# Patient Record
Sex: Female | Born: 1965
Health system: Southern US, Community
[De-identification: ages and names within clinical notes are randomized; demographics above are authoritative.]

## PROBLEM LIST (undated history)

## (undated) DIAGNOSIS — G4700 Insomnia, unspecified: Secondary | ICD-10-CM

## (undated) DIAGNOSIS — I10 Essential (primary) hypertension: Secondary | ICD-10-CM

## (undated) DIAGNOSIS — M4802 Spinal stenosis, cervical region: Secondary | ICD-10-CM

## (undated) DIAGNOSIS — M25571 Pain in right ankle and joints of right foot: Secondary | ICD-10-CM

## (undated) DIAGNOSIS — M25519 Pain in unspecified shoulder: Secondary | ICD-10-CM

## (undated) DIAGNOSIS — R238 Other skin changes: Secondary | ICD-10-CM

## (undated) DIAGNOSIS — E7800 Pure hypercholesterolemia, unspecified: Secondary | ICD-10-CM

## (undated) DIAGNOSIS — K219 Gastro-esophageal reflux disease without esophagitis: Secondary | ICD-10-CM

## (undated) DIAGNOSIS — N644 Mastodynia: Secondary | ICD-10-CM

## (undated) DIAGNOSIS — M4712 Other spondylosis with myelopathy, cervical region: Secondary | ICD-10-CM

## (undated) DIAGNOSIS — R609 Edema, unspecified: Secondary | ICD-10-CM

## (undated) DIAGNOSIS — R7309 Other abnormal glucose: Secondary | ICD-10-CM

## (undated) DIAGNOSIS — E739 Lactose intolerance, unspecified: Secondary | ICD-10-CM

## (undated) DIAGNOSIS — M25562 Pain in left knee: Secondary | ICD-10-CM

## (undated) HISTORY — DX: Pain in left knee: M25.562

## (undated) HISTORY — PX: TUBAL LIGATION: SHX77

## (undated) HISTORY — DX: Gastro-esophageal reflux disease without esophagitis: K21.9

## (undated) HISTORY — DX: Other abnormal glucose: R73.09

## (undated) HISTORY — DX: Essential (primary) hypertension: I10

## (undated) HISTORY — DX: Pain in right ankle and joints of right foot: M25.571

## (undated) HISTORY — DX: Other spondylosis with myelopathy, cervical region: M47.12

## (undated) HISTORY — PX: ENDOMETRIAL ABLATION: SHX621

## (undated) HISTORY — DX: Pure hypercholesterolemia, unspecified: E78.00

## (undated) HISTORY — DX: Other skin changes: R23.8

## (undated) HISTORY — DX: Spinal stenosis, cervical region: M48.02

## (undated) HISTORY — DX: Lactose intolerance, unspecified: E73.9

## (undated) HISTORY — DX: Pain in unspecified shoulder: M25.519

## (undated) HISTORY — DX: Edema, unspecified: R60.9

## (undated) HISTORY — DX: Mastodynia: N64.4

## (undated) HISTORY — DX: Insomnia, unspecified: G47.00

## (undated) HISTORY — PX: OTHER SURGICAL HISTORY: SHX169

---

## 1998-04-15 ENCOUNTER — Inpatient Hospital Stay (HOSPITAL_COMMUNITY): Admission: AD | Admit: 1998-04-15 | Discharge: 1998-04-15 | Payer: Self-pay | Admitting: Obstetrics and Gynecology

## 1998-05-09 ENCOUNTER — Inpatient Hospital Stay (HOSPITAL_COMMUNITY): Admission: AD | Admit: 1998-05-09 | Discharge: 1998-05-10 | Payer: Self-pay | Admitting: Obstetrics and Gynecology

## 1999-03-22 ENCOUNTER — Emergency Department (HOSPITAL_COMMUNITY): Admission: EM | Admit: 1999-03-22 | Discharge: 1999-03-22 | Payer: Self-pay | Admitting: Emergency Medicine

## 1999-06-04 ENCOUNTER — Encounter: Payer: Self-pay | Admitting: Family Medicine

## 1999-06-04 ENCOUNTER — Ambulatory Visit (HOSPITAL_COMMUNITY): Admission: RE | Admit: 1999-06-04 | Discharge: 1999-06-04 | Payer: Self-pay | Admitting: Family Medicine

## 1999-11-19 ENCOUNTER — Encounter: Admission: RE | Admit: 1999-11-19 | Discharge: 1999-11-19 | Payer: Self-pay | Admitting: Family Medicine

## 1999-11-19 ENCOUNTER — Encounter: Payer: Self-pay | Admitting: Family Medicine

## 1999-12-29 ENCOUNTER — Other Ambulatory Visit: Admission: RE | Admit: 1999-12-29 | Discharge: 1999-12-29 | Payer: Self-pay | Admitting: Obstetrics and Gynecology

## 2000-01-01 ENCOUNTER — Observation Stay (HOSPITAL_COMMUNITY): Admission: RE | Admit: 2000-01-01 | Discharge: 2000-01-02 | Payer: Self-pay | Admitting: Obstetrics and Gynecology

## 2000-01-01 ENCOUNTER — Encounter (INDEPENDENT_AMBULATORY_CARE_PROVIDER_SITE_OTHER): Payer: Self-pay | Admitting: Specialist

## 2001-02-11 ENCOUNTER — Other Ambulatory Visit: Admission: RE | Admit: 2001-02-11 | Discharge: 2001-02-11 | Payer: Self-pay | Admitting: Obstetrics and Gynecology

## 2001-09-30 ENCOUNTER — Ambulatory Visit (HOSPITAL_COMMUNITY): Admission: AD | Admit: 2001-09-30 | Discharge: 2001-09-30 | Payer: Self-pay | Admitting: Obstetrics and Gynecology

## 2003-04-04 ENCOUNTER — Other Ambulatory Visit: Admission: RE | Admit: 2003-04-04 | Discharge: 2003-04-04 | Payer: Self-pay | Admitting: Obstetrics and Gynecology

## 2004-11-12 ENCOUNTER — Other Ambulatory Visit: Admission: RE | Admit: 2004-11-12 | Discharge: 2004-11-12 | Payer: Self-pay | Admitting: Obstetrics and Gynecology

## 2005-12-09 ENCOUNTER — Other Ambulatory Visit: Admission: RE | Admit: 2005-12-09 | Discharge: 2005-12-09 | Payer: Self-pay | Admitting: Obstetrics and Gynecology

## 2006-11-12 ENCOUNTER — Ambulatory Visit (HOSPITAL_COMMUNITY): Admission: RE | Admit: 2006-11-12 | Discharge: 2006-11-12 | Payer: Self-pay | Admitting: Obstetrics and Gynecology

## 2006-11-12 ENCOUNTER — Encounter (INDEPENDENT_AMBULATORY_CARE_PROVIDER_SITE_OTHER): Payer: Self-pay | Admitting: Specialist

## 2009-07-05 ENCOUNTER — Encounter: Payer: Self-pay | Admitting: Internal Medicine

## 2009-07-05 ENCOUNTER — Ambulatory Visit: Payer: Self-pay | Admitting: Internal Medicine

## 2009-07-05 ENCOUNTER — Other Ambulatory Visit: Admission: RE | Admit: 2009-07-05 | Discharge: 2009-07-05 | Payer: Self-pay | Admitting: Internal Medicine

## 2009-07-05 DIAGNOSIS — I10 Essential (primary) hypertension: Secondary | ICD-10-CM

## 2009-07-05 HISTORY — DX: Essential (primary) hypertension: I10

## 2009-07-05 LAB — CONVERTED CEMR LAB
Bilirubin Urine: NEGATIVE
Blood in Urine, dipstick: NEGATIVE
Chlamydia, DNA Probe: NEGATIVE
GC Probe Amp, Genital: NEGATIVE
Glucose, Urine, Semiquant: NEGATIVE
Ketones, urine, test strip: NEGATIVE
Nitrite: NEGATIVE
Specific Gravity, Urine: >=1.03
Urobilinogen, UA: 0.2
WBC Urine, dipstick: NEGATIVE
pH: 5

## 2009-07-05 LAB — HM PAP SMEAR

## 2009-07-06 ENCOUNTER — Telehealth: Payer: Self-pay | Admitting: Family Medicine

## 2009-07-08 ENCOUNTER — Telehealth: Payer: Self-pay | Admitting: Internal Medicine

## 2009-07-08 DIAGNOSIS — K219 Gastro-esophageal reflux disease without esophagitis: Secondary | ICD-10-CM

## 2009-07-08 HISTORY — DX: Gastro-esophageal reflux disease without esophagitis: K21.9

## 2009-07-11 ENCOUNTER — Telehealth: Payer: Self-pay | Admitting: Internal Medicine

## 2009-07-12 ENCOUNTER — Telehealth: Payer: Self-pay | Admitting: Internal Medicine

## 2009-10-07 ENCOUNTER — Telehealth: Payer: Self-pay | Admitting: Internal Medicine

## 2009-12-23 ENCOUNTER — Ambulatory Visit: Payer: Self-pay | Admitting: Internal Medicine

## 2009-12-23 LAB — CONVERTED CEMR LAB
Basophils Absolute: 0 10*3/uL (ref 0.0–0.1)
Basophils Relative: 0.7 % (ref 0.0–3.0)
Eosinophils Absolute: 0 10*3/uL (ref 0.0–0.7)
Eosinophils Relative: 0.8 % (ref 0.0–5.0)
HCT: 40.7 % (ref 36.0–46.0)
Hemoglobin: 13.6 g/dL (ref 12.0–15.0)
Lymphocytes Relative: 30.4 % (ref 12.0–46.0)
Lymphs Abs: 1.8 10*3/uL (ref 0.7–4.0)
MCHC: 33.4 g/dL (ref 30.0–36.0)
MCV: 88 fL (ref 78.0–100.0)
Monocytes Absolute: 0.2 10*3/uL (ref 0.1–1.0)
Monocytes Relative: 3.7 % (ref 3.0–12.0)
Neutro Abs: 3.8 10*3/uL (ref 1.4–7.7)
Neutrophils Relative %: 64.4 % (ref 43.0–77.0)
Platelets: 218 10*3/uL (ref 150.0–400.0)
RBC: 4.63 M/uL (ref 3.87–5.11)
RDW: 11.8 % (ref 11.5–14.6)
Sed Rate: 11 mm/hr (ref 0–22)
Tissue Transglutaminase Ab, IgA: 0.1 units (ref <7)
WBC: 5.8 10*3/uL (ref 4.5–10.5)

## 2009-12-24 ENCOUNTER — Encounter: Payer: Self-pay | Admitting: Internal Medicine

## 2009-12-27 ENCOUNTER — Telehealth: Payer: Self-pay | Admitting: Internal Medicine

## 2009-12-27 ENCOUNTER — Encounter (INDEPENDENT_AMBULATORY_CARE_PROVIDER_SITE_OTHER): Payer: Self-pay | Admitting: *Deleted

## 2009-12-31 ENCOUNTER — Encounter (INDEPENDENT_AMBULATORY_CARE_PROVIDER_SITE_OTHER): Payer: Self-pay | Admitting: *Deleted

## 2009-12-31 ENCOUNTER — Ambulatory Visit: Payer: Self-pay | Admitting: Gastroenterology

## 2009-12-31 DIAGNOSIS — E739 Lactose intolerance, unspecified: Secondary | ICD-10-CM

## 2009-12-31 HISTORY — DX: Lactose intolerance, unspecified: E73.9

## 2009-12-31 LAB — CONVERTED CEMR LAB
ALT: 13 units/L (ref 0–35)
AST: 16 units/L (ref 0–37)
Albumin: 4.1 g/dL (ref 3.5–5.2)
Alkaline Phosphatase: 71 units/L (ref 39–117)
Amylase: 93 units/L (ref 27–131)
BUN: 8 mg/dL (ref 6–23)
Basophils Absolute: 0 10*3/uL (ref 0.0–0.1)
Basophils Relative: 0.4 % (ref 0.0–3.0)
Bilirubin, Direct: 0.2 mg/dL (ref 0.0–0.3)
CO2: 30 meq/L (ref 19–32)
CRP, High Sensitivity: 0.9 (ref 0.00–5.00)
Calcium: 9.4 mg/dL (ref 8.4–10.5)
Chloride: 107 meq/L (ref 96–112)
Creatinine, Ser: 0.8 mg/dL (ref 0.4–1.2)
Eosinophils Absolute: 0.1 10*3/uL (ref 0.0–0.7)
Eosinophils Relative: 1.1 % (ref 0.0–5.0)
Ferritin: 70.9 ng/mL (ref 10.0–291.0)
Folate: 10.1 ng/mL
GFR calc non Af Amer: 101.27 mL/min (ref >60)
Glucose, Bld: 94 mg/dL (ref 70–99)
HCT: 41 % (ref 36.0–46.0)
Hemoglobin: 13.7 g/dL (ref 12.0–15.0)
Iron: 64 ug/dL (ref 42–145)
Lipase: 20 units/L (ref 11.0–59.0)
Lymphocytes Relative: 33.6 % (ref 12.0–46.0)
Lymphs Abs: 2.3 10*3/uL (ref 0.7–4.0)
MCHC: 33.4 g/dL (ref 30.0–36.0)
MCV: 89.9 fL (ref 78.0–100.0)
Monocytes Absolute: 0.3 10*3/uL (ref 0.1–1.0)
Monocytes Relative: 4.2 % (ref 3.0–12.0)
Neutro Abs: 4 10*3/uL (ref 1.4–7.7)
Neutrophils Relative %: 60.7 % (ref 43.0–77.0)
Platelets: 238 10*3/uL (ref 150.0–400.0)
Potassium: 4 meq/L (ref 3.5–5.1)
RBC: 4.56 M/uL (ref 3.87–5.11)
RDW: 11.8 % (ref 11.5–14.6)
Saturation Ratios: 18.4 % — ABNORMAL LOW (ref 20.0–50.0)
Sodium: 141 meq/L (ref 135–145)
TSH: 1.44 microintl units/mL (ref 0.35–5.50)
Total Bilirubin: 1.2 mg/dL (ref 0.3–1.2)
Total Protein: 7.6 g/dL (ref 6.0–8.3)
Transferrin: 248 mg/dL (ref 212.0–360.0)
Vitamin B-12: 304 pg/mL (ref 211–911)
WBC: 6.7 10*3/uL (ref 4.5–10.5)

## 2010-01-15 ENCOUNTER — Ambulatory Visit: Payer: Self-pay | Admitting: Gastroenterology

## 2010-01-20 ENCOUNTER — Encounter: Payer: Self-pay | Admitting: Gastroenterology

## 2010-01-27 ENCOUNTER — Telehealth: Payer: Self-pay | Admitting: Internal Medicine

## 2010-02-06 ENCOUNTER — Telehealth: Payer: Self-pay | Admitting: Internal Medicine

## 2010-02-24 ENCOUNTER — Telehealth: Payer: Self-pay | Admitting: Internal Medicine

## 2010-03-19 ENCOUNTER — Ambulatory Visit: Payer: Self-pay | Admitting: Internal Medicine

## 2010-03-19 DIAGNOSIS — M5412 Radiculopathy, cervical region: Secondary | ICD-10-CM | POA: Insufficient documentation

## 2010-03-19 DIAGNOSIS — M4712 Other spondylosis with myelopathy, cervical region: Secondary | ICD-10-CM | POA: Insufficient documentation

## 2010-03-19 DIAGNOSIS — M542 Cervicalgia: Secondary | ICD-10-CM | POA: Insufficient documentation

## 2010-03-19 HISTORY — DX: Other spondylosis with myelopathy, cervical region: M47.12

## 2010-03-26 ENCOUNTER — Ambulatory Visit (HOSPITAL_COMMUNITY): Admission: RE | Admit: 2010-03-26 | Discharge: 2010-03-26 | Payer: Self-pay | Admitting: Internal Medicine

## 2010-03-27 ENCOUNTER — Telehealth: Payer: Self-pay | Admitting: Internal Medicine

## 2010-03-27 DIAGNOSIS — M4802 Spinal stenosis, cervical region: Secondary | ICD-10-CM

## 2010-03-27 HISTORY — DX: Spinal stenosis, cervical region: M48.02

## 2010-05-01 ENCOUNTER — Telehealth: Payer: Self-pay | Admitting: Internal Medicine

## 2010-05-08 ENCOUNTER — Telehealth: Payer: Self-pay | Admitting: Internal Medicine

## 2010-11-25 NOTE — Progress Notes (Signed)
Summary: PRILOSEC  Phone Note Call from Patient Call back at [facility] Phone [phone]   Summary of Call: Pt had stopped prilosec to see if there was a noticable difference in her bowel movements. Ranitidine 300mg  has not helped reflux symptoms and bowel movment have not changed. OK to resume prilosec?  Initial call taken by: Lamar Sprinkles, CMA,  Feb 24, 2010 3:27 PM  Follow-up for Phone Call        yes Follow-up by: Etta Grandchild MD,  Feb 25, 2010 7:23 AM  Additional Follow-up for Phone Call Additional follow up Details #1::        pt informed Additional Follow-up by: Margaret Pyle, CMA,  Feb 25, 2010 9:00 AM

## 2010-11-25 NOTE — Progress Notes (Signed)
Summary: Refill--Lasix  Phone Note Refill Request Message from:  Fax from Pharmacy on May 01, 2010 9:12 AM  Refills Requested: Medication #1:  LASIX 20 MG TABS 1/2 tab by mouth BID Initial call taken by: Lucious Groves,  May 01, 2010 9:12 AM    Prescriptions: LASIX 20 MG TABS (FUROSEMIDE) 1/2 tab by mouth BID  #30 x 9   Entered by:   Lucious Groves   Authorized by:   Etta Grandchild MD   Signed by:   Lucious Groves on 05/01/2010   Method used:   [facility] [address] [phone][facility] (retail)       [address]       [place], [place]  [address]       Ph: [phone] or [phone]       Fax: [phone]   RxID:   [id number]

## 2010-11-25 NOTE — Progress Notes (Signed)
Summary: REFILL   Phone Note Call from Patient   Summary of Call: Patient is [facility] [address]. Refill complete, need to call pt Initial call taken by: Lamar Sprinkles, CMA,  May 08, 2010 10:58 AM  Follow-up for Phone Call        LMOVM for pt to call back.Marland KitchenMarland KitchenAlvy Beal Archie CMA  May 08, 2010 11:50 AM   Patient called back stating that she is aware of medication at [facility]    Prescriptions: LASIX 20 MG TABS (FUROSEMIDE) 1/2 tab by mouth BID  #30 x 9   Entered by:   Lamar Sprinkles, CMA   Authorized by:   Etta Grandchild MD   Signed by:   Lamar Sprinkles, CMA on 05/08/2010   Method used:   Electronically to        [facility] (retail)       [address]       [place], [place]  [address]       Ph: [phone]       Fax: [phone]   RxID:   [id number]

## 2010-11-25 NOTE — Letter (Signed)
Summary: [facility] Instructions  [facility]  [address] [place], [place] [address]   Phone: [phone]  Fax: [phone]       Wall Wall    02-12-1966    MRN: [id number]        Procedure Day Dorna Bloom: Wednesday, 01/15/10     Arrival Time: 1:00      Procedure Time: 2:00     Location of Procedure:                    _X _  [facility] (4th Floor)                        PREPARATION FOR COLONOSCOPY WITH MOVIPREP   Starting 5 days prior to your procedure 01/10/10 do not eat nuts, seeds, popcorn, corn, beans, peas,  salads, or any raw vegetables.  Do not take any fiber supplements (e.g. Metamucil, Citrucel, and Benefiber).  THE DAY BEFORE YOUR PROCEDURE         DATE: 01/14/10  DAY: Tuesday  1.  Drink clear liquids the entire day-NO SOLID FOOD  2.  Do not drink anything colored red or purple.  Avoid juices with pulp.  No orange juice.  3.  Drink at least 64 oz. (8 glasses) of fluid/clear liquids during the day to prevent dehydration and help the prep work efficiently.  CLEAR LIQUIDS INCLUDE: Water Jello Ice Popsicles Tea (sugar ok, no milk/cream) Powdered fruit flavored drinks Coffee (sugar ok, no milk/cream) Gatorade Juice: apple, white grape, white cranberry  Lemonade Clear bullion, consomm, broth Carbonated beverages (any kind) Strained chicken noodle soup Wall Wall                             4.  In the morning, mix first dose of MoviPrep solution:    Empty 1 Pouch A and 1 Pouch B into the disposable container    Add lukewarm drinking water to the top line of the container. Mix to dissolve    Refrigerate (mixed solution should be used within 24 hrs)  5.  Begin drinking the prep at 5:00 p.m. The MoviPrep container is divided by 4 marks.   Every 15 minutes drink the solution down to the next mark (approximately 8 oz) until the full liter is complete.   6.  Follow completed prep with 16 oz of clear liquid of your choice (Nothing  red or purple).  Continue to drink clear liquids until bedtime.  7.  Before going to bed, mix second dose of MoviPrep solution:    Empty 1 Pouch A and 1 Pouch B into the disposable container    Add lukewarm drinking water to the top line of the container. Mix to dissolve    Refrigerate  THE DAY OF YOUR PROCEDURE      DATE: 01/15/10  DAY: Wednesday  Beginning at 9:00 a.m. (5 hours before procedure):         1. Every 15 minutes, drink the solution down to the next mark (approx 8 oz) until the full liter is complete.  2. Follow completed prep with 16 oz. of clear liquid of your choice.    3. You may drink clear liquids until 12:00  (2 HOURS BEFORE PROCEDURE).   MEDICATION INSTRUCTIONS  Unless otherwise instructed, you should take regular prescription medications with a small sip of water   as early as possible the morning of  your procedure.                OTHER INSTRUCTIONS  You will need a responsible adult at least 43 years of age to accompany you and drive you home.   This person must remain in the waiting room during your procedure.  Wear loose fitting clothing that is easily removed.  Leave jewelry and other valuables at home.  However, you may wish to bring a book to read or  an iPod/MP3 player to listen to music as you wait for your procedure to start.  Remove all body piercing jewelry and leave at home.  Total time from sign-in until discharge is approximately 2-3 hours.  You should go home directly after your procedure and rest.  You can resume normal activities the  day after your procedure.  The day of your procedure you should not:   Drive   Make legal decisions   Operate machinery   Drink alcohol   Return to work  You will receive specific instructions about eating, activities and medications before you leave.    The above instructions have been reviewed and explained to me by   _______________________    I fully understand and can  verbalize these instructions _____________________________ Date _________

## 2010-11-25 NOTE — Assessment & Plan Note (Signed)
Summary: STOMACH ISSUES REQ 3/1 APPT/#/CD   Vital Signs:  Patient profile:   45 year old female Height:      58 inches Weight:      141 pounds BMI:     29.58 O2 Sat:      98 % on Room air Temp:     97.7 degrees F oral Pulse rate:   89 / minute Pulse rhythm:   regular Resp:     16 per minute BP sitting:   136 / 88  (left arm)  Vitals Entered By: Lucious Groves (December 23, 2009 8:49 AM)  Nutrition Counseling: Patient's BMI is greater than 25 and therefore counseled on weight management options.  O2 Flow:  Room air CC: C/O loose stools after eating that is getting problematic and would like spot on neck looked at./kb, Diarrhea, Hypertension Management, Abdominal Pain Is Patient Diabetic? No Pain Assessment Patient in pain? no        Primary Care Provider:  Etta Grandchild MD  CC:  C/O loose stools after eating that is getting problematic and would like spot on neck looked at./kb, Diarrhea, Hypertension Management, and Abdominal Pain.  History of Present Illness:  Diarrhea      This is a 45 year old woman who presents with Diarrhea.  The symptoms began 4-6 months ago.  The severity is described as mild.  The patient reports 3 stools or less per day, semiformed/loose stools, gassiness, and gradual onset of symptoms, but denies watery/unformed stools, voluminous stools, blood in stool, mucus in stool, greasy stools, malodorous stools, fecal urgency, fecal soiling, alternating diarrhea/constipation, nocturnal diarrhea, fasting diarrhea, bloating, and abrupt onset of symptoms.  Associated symptoms include abdominal cramps and weight loss.  The patient denies fever, abdominal pain, nausea, vomiting, lightheadedness, increased thirst, joint pains, mouth ulcers, and eye redness.  The symptoms are worse with any food.  The symptoms are better with fasting.  Patient has a  history of lactose intolerance.    Dyspepsia History:      She has no alarm features of dyspepsia including no history  of melena, hematochezia, dysphagia, persistent vomiting, or involuntary weight loss > 5%.  There is a prior history of GERD.  The patient does not have a prior history of documented ulcer disease.  The dominant symptom is heartburn or acid reflux.  An H-2 blocker medication is currently being taken.  She notes that the symptoms have improved with the H-2 blocker therapy.  Symptoms have not persisted after 4 weeks of H-2 blocker treatment.  She has no history of a positive H. Pylori serology.  No previous upper endoscopy has been done.    Hypertension History:      She denies headache, chest pain, palpitations, dyspnea with exertion, orthopnea, peripheral edema, visual symptoms, neurologic problems, syncope, and side effects from treatment.  She notes no problems with any antihypertensive medication side effects.        Positive major cardiovascular risk factors include hypertension.  Negative major cardiovascular risk factors include female age less than 47 years old, no history of diabetes or hyperlipidemia, negative family history for ischemic heart disease, and non-tobacco-user status.        Further assessment for target organ damage reveals no history of ASHD, cardiac end-organ damage (CHF/LVH), stroke/TIA, peripheral vascular disease, renal insufficiency, or hypertensive retinopathy.      Current Medications (verified): 1)  Prilosec Otc 20 Mg Tbec (Omeprazole Magnesium) 2)  Atenolol 50 Mg Tabs (Atenolol) 3)  Lasix 20  Mg Tabs (Furosemide) .... 1/2 Tab By Mouth Bid  Allergies (verified): 1)  ! Percocet 2)  ! Darvocet  Past History:  Past Medical History: Reviewed history from 07/05/2009 and no changes required. Hypertension GERD  Past Surgical History: Reviewed history from 07/05/2009 and no changes required. Endometrial ablation BTL post- bilateral ectopics  Family History: Reviewed history from 07/05/2009 and no changes required. Family History Breast cancer 1st degree relative  <50 Family History Hypertension  Social History: Reviewed history from 07/05/2009 and no changes required. Occupation: HR Divorced Never Smoked Drug use-no Regular exercise-yes  Review of Systems       The patient complains of weight loss.  The patient denies anorexia, fever, chest pain, peripheral edema, prolonged cough, headaches, hemoptysis, melena, hematochezia, severe indigestion/heartburn, incontinence, suspicious skin lesions, depression, enlarged lymph nodes, and angioedema.    Physical Exam  General:  alert, well-developed, well-nourished, well-hydrated, appropriate dress, normal appearance, healthy-appearing, cooperative to examination, and good hygiene.   Head:  normocephalic and atraumatic.   Eyes:  pink moist mm., no icterus. Ears:  R ear normal and L ear normal.   Mouth:  Oral mucosa and oropharynx without lesions or exudates.  Teeth in good repair. Neck:  supple, full ROM, no masses, no thyromegaly, no thyroid nodules or tenderness, no JVD, no cervical lymphadenopathy, and no neck tenderness.   Lungs:  normal respiratory effort, no intercostal retractions, no accessory muscle use, normal breath sounds, no dullness, no fremitus, no crackles, and no wheezes.   Heart:  normal rate, regular rhythm, no murmur, no gallop, no rub, and no JVD.   Abdomen:  soft, non-tender, normal bowel sounds, no distention, no masses, no guarding, no rigidity, no hepatomegaly, and no splenomegaly.   Rectal:  No external abnormalities noted. Normal sphincter tone. No rectal masses or tenderness.  heme negative stool. Msk:  No deformity or scoliosis noted of thoracic or lumbar spine.   Pulses:  R and L carotid,radial,femoral,dorsalis pedis and posterior tibial pulses are full and equal bilaterally Extremities:  No clubbing, cyanosis, edema, or deformity noted with normal full range of motion of all joints.   Neurologic:  No cranial nerve deficits noted. Station and gait are normal. Plantar  reflexes are down-going bilaterally. DTRs are symmetrical throughout. Sensory, motor and coordinative functions appear intact. Skin:  Intact without suspicious lesions or rashes Cervical Nodes:  no anterior cervical adenopathy and no posterior cervical adenopathy.   Axillary Nodes:  no R axillary adenopathy and no L axillary adenopathy.   Inguinal Nodes:  no R inguinal adenopathy and no L inguinal adenopathy.   Psych:  Cognition and judgment appear intact. Alert and cooperative with normal attention span and concentration. No apparent delusions, illusions, hallucinations   Impression & Recommendations:  Problem # 1:  DIARRHEA (ICD-787.91) Assessment New will stop the ppi since thay can cause diarrhea. will look for infectious and metabolic causes and screen for celiac disease. she will abstain from all dairy products. Orders: T-Sprue Panel (Celiac Disease Aby Eval) (83516x3/[address]) Venipuncture (62831) T-Culture, C-Diff Toxin A/B (51761-60737) T-Stool for O&P (10626-94854) T-Stool Giardia / Crypto- EIA (62703) TLB-CBC Platelet - w/Differential (85025-CBCD) TLB-Sedimentation Rate (ESR) (85652-ESR)  Problem # 2:  GERD (ICD-530.81) Assessment: Unchanged  The following medications were removed from the medication list:    Prilosec Otc 20 Mg Tbec (Omeprazole magnesium)    Prilosec 20 Mg Cpdr (Omeprazole) ..... Once daily Her updated medication list for this problem includes:    Ranitidine Hcl 300 Mg Tabs (Ranitidine hcl) .Marland KitchenMarland KitchenMarland KitchenMarland Kitchen  One by mouth at bedtime for heartburn  Problem # 3:  HYPERTENSION (ICD-401.9) Assessment: Unchanged  Her updated medication list for this problem includes:    Atenolol 50 Mg Tabs (Atenolol)    Lasix 20 Mg Tabs (Furosemide) .Marland Kitchen... 1/2 tab by mouth bid  BP today: 136/88 Prior BP: 128/90 (07/05/2009)  Prior 10 Yr Risk Heart Disease: Not enough information (07/05/2009)  Complete Medication List: 1)  Atenolol 50 Mg Tabs (Atenolol) 2)  Lasix 20 Mg Tabs  (Furosemide) .... 1/2 tab by mouth bid 3)  Ranitidine Hcl 300 Mg Tabs (Ranitidine hcl) .... One by mouth at bedtime for heartburn  Hypertension Assessment/Plan:      The patient's hypertensive risk group is category A: No risk factors and no target organ damage.  Today's blood pressure is 136/88.  Her blood pressure goal is < 140/90.  Patient Instructions: 1)  Please schedule a follow-up appointment in 2 weeks. 2)  Avoid foods high in acid (tomatoes, citrus juices, spicy foods). Avoid eating within two hours of lying down or before exercising. Do not over eat; try smaller more frequent meals. Elevate head of bed twelve inches when sleeping. Prescriptions: RANITIDINE HCL 300 MG TABS (RANITIDINE HCL) One by mouth at bedtime for heartburn  #30 x 11   Entered and Authorized by:   Etta Grandchild MD   Signed by:   Etta Grandchild MD on 12/23/2009   Method used:   Print then Give to Patient   RxID:   [id number]

## 2010-11-25 NOTE — Progress Notes (Signed)
Summary: Injury  Phone Note Call from Patient Call back at Home Phone [phone]   Summary of Call: Pt hit her head on a car door a few days ago. C/o "bump" on her head. She has used ice and swelling has gotten better. Area is still slightly tender to touch but much better. She is alternating heat and cold. No dizzyness, vision changes, vomiting/nausea, confusion or other complaints. She now has bruised looking area below bump which is looks like she has a black eye. Advised that if area did not continue to improve contact office for eval. Any further advice?  Initial call taken by: Lamar Sprinkles, CMA,  February 06, 2010 9:08 AM  Follow-up for Phone Call        no Follow-up by: Etta Grandchild MD,  February 06, 2010 9:12 AM

## 2010-11-25 NOTE — Progress Notes (Signed)
Summary: test results  Phone Note Call from Patient Call back at Home Phone [phone]   Summary of Call: Patient left message on triage requesting that her Ranitidine prescription be called into [facility] on [address], and she would like someone to call her with test results. Initial call taken by: Lucious Groves,  December 27, 2009 11:53 AM  Follow-up for Phone Call        all is negative/normal Follow-up by: Etta Grandchild MD,  December 27, 2009 11:56 AM  Additional Follow-up for Phone Call Additional follow up Details #1::        Pt notified and would like to GI. Additional Follow-up by: Lucious Groves,  December 27, 2009 12:41 PM    Additional Follow-up for Phone Call Additional follow up Details #2::    done Follow-up by: Etta Grandchild MD,  December 27, 2009 12:52 PM  Prescriptions: RANITIDINE HCL 300 MG TABS (RANITIDINE HCL) One by mouth at bedtime for heartburn  #30 x 11   Entered and Authorized by:   Etta Grandchild MD   Signed by:   Etta Grandchild MD on 12/27/2009   Method used:   Electronically to        [facility] [address] (retail)       [address]       [place], [place]  [address]       Ph: [phone]       Fax: [phone]   RxID:   [id number]

## 2010-11-25 NOTE — Letter (Signed)
Summary: Patient Notice- Colon Biospy Results  [facility]  [address] [place], [place] [address]   Phone: [phone]  Fax: [phone]        January 20, 2010 MRN: [id number]    Angela Wall [address] [place], [place]  [address]    Dear Ms. HOUDE,  I am pleased to inform you that the biopsies taken during your recent colonoscopy did not show any evidence of cancer upon pathologic examination.  Additional information/recommendations:  __No further action is needed at this time.  Please follow-up with      your primary care physician for your other healthcare needs.  _X_Please call [phone] to schedule a return visit to review      your condition.  _X_Continue with the treatment plan as outlined on the day of your      exam.  __You should have a repeat colonoscopy examination for this problem           in _ years.  Please call us if you are having persistent problems or have questions about your condition that have not been fully answered at this time.  Sincerely,  Mardella Layman MD [facility]   This letter has been electronically signed by your physician.  Appended Document: Patient Notice- Colon Biospy Results letter mailed 3.29.11

## 2010-11-25 NOTE — Progress Notes (Signed)
  Phone Note Refill Request Message from:  Fax from Pharmacy on January 27, 2010 8:56 AM  Refills Requested: Medication #1:  ATENOLOL 50 MG TABS one tablet by mouth once daily Initial call taken by: Rock Nephew CMA,  January 27, 2010 8:56 AM    Prescriptions: ATENOLOL 50 MG TABS (ATENOLOL) one tablet by mouth once daily  #30 x 11   Entered by:   Rock Nephew CMA   Authorized by:   Etta Grandchild MD   Signed by:   Rock Nephew CMA on 01/27/2010   Method used:   Electronically to        [facility] (retail)       [address]       [place], [place]  [address]       Ph: [phone]       Fax: [phone]   RxID:   [id number]

## 2010-11-25 NOTE — Procedures (Signed)
Summary: Colonoscopy  Patient: Angela Wall Note: All result statuses are Final unless otherwise noted.  Tests: (1) Colonoscopy (COL)   COL Colonoscopy           DONE     [facility]     [address]     [place], [place]  [address]           COLONOSCOPY PROCEDURE REPORT           PATIENT:  Angela Wall, Angela Wall  MR#:  [id number]     BIRTHDATE:  1966/04/14, 43 yrs. old  GENDER:  female     ENDOSCOPIST:  Vania Rea. Jarold Motto, MD, [facility]     REFERRED BY:           PROCEDURE DATE:  01/15/2010     PROCEDURE:  Colonoscopy with biopsy     ASA CLASS:  Class II     INDICATIONS:  unexplained diarrhea     MEDICATIONS:   Fentanyl 50 mcg IV, Versed 5 mg IV           DESCRIPTION OF PROCEDURE:   After the risks benefits and     alternatives of the procedure were thoroughly explained, informed     consent was obtained.  Digital rectal exam was performed and     revealed no abnormalities.   The LB [id number] endoscope     was introduced through the anus and advanced to the cecum, which     was identified by both the appendix and ileocecal valve, without     limitations.  The quality of the prep was excellent, using     MoviPrep.  The instrument was then slowly withdrawn as the colon     was fully examined.     <<PROCEDUREIMAGES>>           FINDINGS:  Moderate diverticulosis was found in the in the sigmoid     to descending colon segments.  No polyps or cancers were seen.     This was otherwise a normal examination of the colon. RANDOM     BIOPSIES DONE EVERY 10 CM.   Retroflexed views in the rectum     revealed no abnormalities.    The scope was then withdrawn from     the patient and the procedure completed.           COMPLICATIONS:  None     ENDOSCOPIC IMPRESSION:     1) Moderate diverticulosis in the sigmoid to descending colon     segments     2) No polyps or cancers     3) Otherwise normal examination     R/O MICROSCOPIC /COLLAGENOUS COLITIS.     RECOMMENDATIONS:   1) Await biopsy results     2) high fiber diet     CONSIDER LOTRONEX RX. RX DAILY BENEFIBER TRIAL.     REPEAT EXAM:  No           ______________________________     Vania Rea. Jarold Motto, MD, [facility] Graham           CC:  Etta Grandchild, MD           n.     [facility]ie [facility]   Vania Rea. Eli Pattillo at 01/15/2010 02:39 PM           Vallarie Mare, [phone]  Note: An exclamation mark (!) indicates a result that was not dispersed into the flowsheet. Document Creation Date: 01/15/2010 2:39 PM _______________________________________________________________________  (1) Order result status: Final  Collection or observation date-time: 01/15/2010 14:32 Requested date-time:  Receipt date-time:  Reported date-time:  Referring Physician:   Ordering Physician: Sheryn Bison [phone]) Specimen Source:  Source: Launa Grill Order Number: [id number] Lab site:

## 2010-11-25 NOTE — Progress Notes (Signed)
  Phone Note Outgoing Call   Call placed by: MD Summary of Call: the MRI shows herniated discs and spinal cord/nerve damage, please tell her that I have referred her to a neurosurgeon Initial call taken by: Etta Grandchild MD,  March 27, 2010 10:35 AM  Follow-up for Phone Call        Pt informed  Follow-up by: Lamar Sprinkles, CMA,  March 28, 2010 1:57 PM  New Problems: SPINAL STENOSIS, CERVICAL (ICD-723.0)   New Problems: SPINAL STENOSIS, CERVICAL (ICD-723.0)

## 2010-11-25 NOTE — Letter (Signed)
Summary: New Patient letter  [facility]  [address] [place], [place] [address]   Phone: [phone]  Fax: [phone]       12/27/2009 MRN: [id number]  BETZAIRA MENTEL [address] [place], [place]  [address]  Dear Ms. Mayford Knife,  Welcome to the Gastroenterology Division at [facility].    You are scheduled to see Dr.  Jarold Motto on  12-31-09 at  9am on the 3rd floor at [facility], [address]  We ask that you try to arrive at our office 15 minutes prior to your appointment time to allow for check-in.  We would like you to complete the enclosed self-administered evaluation form prior to your visit and bring it with you on the day of your appointment.  We will review it with you.  Also, please bring a complete list of all your medications or, if you prefer, bring the medication bottles and we will list them.  Please bring your insurance card so that we may make a copy of it.  If your insurance requires a referral to see a specialist, please bring your referral form from your primary care physician.  Co-payments are due at the time of your visit and may be paid by cash, check or credit card.     Your office visit will consist of a consult with your physician (includes a physical exam), any laboratory testing he/she may order, scheduling of any necessary diagnostic testing (e.g. x-ray, ultrasound, CT-scan), and scheduling of a procedure (e.g. Endoscopy, Colonoscopy) if required.  Please allow enough time on your schedule to allow for any/all of these possibilities.    If you cannot keep your appointment, please call [phone] to cancel or reschedule prior to your appointment date.  This allows [place] the opportunity to schedule an appointment for another patient in need of care.  If you do not cancel or reschedule by 5 p.m. the business day prior to your appointment date, you will be charged a $50.00 late cancellation/no-show fee.    Thank you for choosing  [facility] for your medical needs.  We appreciate the opportunity to care for you.  Please visit [place] at our website  to learn more about our practice.                     Sincerely,                                                             The Gastroenterology Division
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
## 2010-11-25 NOTE — Assessment & Plan Note (Signed)
Summary: DIARRHEA/YF   History of Present Illness Visit Type: consult  Primary GI MD: Sheryn Bison MD FACP FAGA Primary Provider: Etta Grandchild MD Requesting Provider: Etta Grandchild MD Chief Complaint: Diarrhea, heartburn, belching, bloating  and RLQ abd pain  History of Present Illness:   45 year old [place] American female referred through the courtesy of Dr. Sanda Linger for evaluation of several months of refractory diarrhea.  This patient has mild essential hypertension and chronic acid reflux disease. She been doing well until several months ago when she developed abdominal cramping with right lower quadrant pain, 5-6 loose bowel movements a day, and no systemic complaints or rectal bleeding. Excellent examination and workup by Dr. Yetta Barre has been negative including multiple stool exams. Patient denies infectious disease exposure, foreign travel, antibiotic use, sick family members at home, new medications, or a systemic complaints, nausea vomiting, or hepatobiliary symptoms. She has tried Imodium with mild improvement. She does have rather severe lactose intolerance but denies consumption of sorbitol or fructose. There's been no anorexia, weight loss, fever, chills, skin rashes, mouth sores etc. Recently her Prilosec was discontinued and she was placed on ranitidine 3 mg day for GERD.  She said several previous gynecologic procedures including endometrial ablation and does not have menstrual periods. Family history is noncontributory. Recent lab data for celiac disease was negative.   GI Review of Systems    Reports abdominal pain, acid reflux, belching, bloating, and  heartburn.     Location of  Abdominal pain: RLQ.    Denies chest pain, dysphagia with liquids, dysphagia with solids, loss of appetite, nausea, vomiting, vomiting blood, weight loss, and  weight gain.      Reports change in bowel habits and  diarrhea.     Denies anal fissure, black tarry stools, constipation,  diverticulosis, fecal incontinence, heme positive stool, hemorrhoids, irritable bowel syndrome, jaundice, light color stool, liver problems, rectal bleeding, and  rectal pain.    Current Medications (verified): 1)  Atenolol 50 Mg Tabs (Atenolol) .... One Tablet By Mouth Once Daily 2)  Lasix 20 Mg Tabs (Furosemide) .... 1/2 Tab By Mouth Bid 3)  Ranitidine Hcl 300 Mg Tabs (Ranitidine Hcl) .... One By Mouth At Bedtime For Heartburn  Allergies (verified): 1)  ! Percocet 2)  ! Darvocet  Past History:  Past medical, surgical, family and social histories (including risk factors) reviewed for relevance to current acute and chronic problems.  Past Medical History: Reviewed history from 07/05/2009 and no changes required. Hypertension GERD  Past Surgical History: Reviewed history from 07/05/2009 and no changes required. Endometrial ablation BTL post- bilateral ectopics  Family History: Reviewed history from 07/05/2009 and no changes required. Family History Breast cancer 1st degree relative <50--Mother  Family History Hypertension No FH of Colon Cancer: Family History of Kidney Disease:Maternal Uncle   Social History: Reviewed history from 07/05/2009 and no changes required. Occupation: HR Divorced Never Smoked Drug use-no Regular exercise-yes Daily Caffeine Use: one or two daily   Review of Systems       The patient complains of swelling of feet/legs.  The patient denies allergy/sinus, anemia, anxiety-new, arthritis/joint pain, back pain, blood in urine, breast changes/lumps, change in vision, confusion, cough, coughing up blood, depression-new, fainting, fatigue, fever, headaches-new, hearing problems, heart murmur, heart rhythm changes, itching, menstrual pain, muscle pains/cramps, night sweats, nosebleeds, pregnancy symptoms, shortness of breath, skin rash, sleeping problems, sore throat, swollen lymph glands, thirst - excessive , urination - excessive , urination changes/pain,  urine leakage, vision changes,  and voice change.    Vital Signs:  Patient profile:   45 year old female Height:      58 inches Weight:      139 pounds BMI:     29.16 BSA:     1.56 Pulse rate:   88 / minute Pulse rhythm:   regular BP sitting:   128 / 84  (left arm) Cuff size:   regular  Vitals Entered By: Ok Anis CMA (December 31, 2009 9:10 AM)  Physical Exam  General:  Well developed, well nourished, no acute distress.healthy appearing.   Head:  Normocephalic and atraumatic. Eyes:  PERRLA, no icterus. Mouth:  No deformity or lesions, dentition normal. Neck:  Supple; no masses or thyromegaly. Lungs:  Clear throughout to auscultation. Heart:  Regular rate and rhythm; no murmurs, rubs,  or bruits. Abdomen:  Soft, nontender and nondistended. No masses, hepatosplenomegaly or hernias noted. Normal bowel sounds. Rectal:  deferred until time of colonoscopy.   Msk:  Symmetrical with no gross deformities. Normal posture. Pulses:  Normal pulses noted. Extremities:  No clubbing, cyanosis, edema or deformities noted. Neurologic:  Alert and  oriented x4;  grossly normal neurologically. Skin:  Intact without significant lesions or rashes. Cervical Nodes:  No significant cervical adenopathy. Psych:  Alert and cooperative. Normal mood and affect.   Impression & Recommendations:  Problem # 1:  DIARRHEA (ICD-787.91) Assessment Unchanged Probable diarrhea predominant irritable bowel syndrome in a patient with rather marked lactose intolerance. Further screening laboratory data has been ordered and we'll do colonoscopy and random colon biopsies. Lactose-free diet has been provided to the patient. In the interim we will try probiotic therapy. Orders: TLB-CBC Platelet - w/Differential (85025-CBCD) TLB-BMP (Basic Metabolic Panel-BMET) (80048-METABOL) TLB-Hepatic/Liver Function Pnl (80076-HEPATIC) TLB-TSH (Thyroid Stimulating Hormone) (84443-TSH) TLB-B12, Serum-Total ONLY  (16109-U04) TLB-Ferritin (82728-FER) TLB-Folic Acid (Folate) (82746-FOL) TLB-IBC Pnl (Iron/FE;Transferrin) (83550-IBC) TLB-CRP-High Sensitivity (C-Reactive Protein) (86140-FCRP) T-Beta Carotene (54098-11914) TLB-Amylase (82150-AMYL) TLB-Lipase (83690-LIPASE) T-Giardia Lamblia IFA (78295-62130)  Problem # 2:  GERD (ICD-530.81) Assessment: Improved Continue reflex regime and ranitidine therapy.  Problem # 3:  HYPERTENSION (ICD-401.9) Assessment: Improved Blood Pressure Today is 128/84. She is to continue atenolol 50 mg a day and Lasix 20 mg half a tablet a day.  Problem # 4:  LACTOSE INTOLERANCE (ICD-271.3) Assessment: Unchanged Lactose Free Diet and avoidance of sorbitol and fructose.  Other Orders: Colonoscopy (Colon)  Patient Instructions: 1)  Begin Align daily.  Buy OTC.   2)  Go th the bascment for labs. 3)  You will be scheduled for a colonoscopy.  4)  Lactose Free Diet handout given.  5)  The medication list was reviewed and reconciled.  All changed / newly prescribed medications were explained.  A complete medication list was provided to the patient / caregiver. 6)  Colonoscopy and Flexible Sigmoidoscopy brochure given.  7)  Conscious Sedation brochure given.  8)  Copy sent to : Dr. Sanda Linger. Prescriptions: MOVIPREP 100 GM  SOLR (PEG-KCL-NACL-NASULF-NA ASC-C) As per prep instructions.  #1 x 0   Entered by:   Ashok Cordia RN   Authorized by:   Mardella Layman MD [facility]   Signed by:   Ashok Cordia RN on 12/31/2009   Method used:   [facility]        [facility] [address] [phone]* (retail)       [address]       [place], [place]  [address]  Ph: [phone] or [phone]       Fax: [phone]   RxID:   [id number]   Appended Document: DIARRHEA/YF Pt req rx be sent to [facility] on [address].   Clinical Lists Changes  Medications: Rx of MOVIPREP 100 GM  SOLR (PEG-KCL-NACL-NASULF-NA ASC-C) As per prep instructions.;   #1 x 0;  Signed;  Entered by: Ashok Cordia RN;  Authorized by: Mardella Layman MD [facility];  Method used: Electronically to [facility] [address], [place], [place], [place]  [address], Ph: [phone], Fax: [phone]    Prescriptions: MOVIPREP 100 GM  SOLR (PEG-KCL-NACL-NASULF-NA ASC-C) As per prep instructions.  #1 x 0   Entered by:   Ashok Cordia RN   Authorized by:   Mardella Layman MD [facility]   Signed by:   Ashok Cordia RN on 12/31/2009   Method used:   Electronically to        [facility] [address] (retail)       [address]       [place], [place]  [address]       Ph: [phone]       Fax: [phone]   RxID:   [id number]

## 2011-01-28 ENCOUNTER — Telehealth: Payer: Self-pay

## 2011-01-28 MED ORDER — ATENOLOL 50 MG PO TABS: 50.0000 mg | ORAL_TABLET | Freq: Every day | ORAL | Status: DC

## 2011-01-28 NOTE — Telephone Encounter (Signed)
Received refill request from [facility] for atenolol 50mg . Patient last seen 03/19/10 and last rx 01/27/10 #30/11rf. Is this ok to refill?

## 2011-01-28 NOTE — Telephone Encounter (Signed)
yes

## 2011-02-19 ENCOUNTER — Other Ambulatory Visit: Payer: Self-pay | Admitting: Internal Medicine

## 2011-03-06 ENCOUNTER — Other Ambulatory Visit: Payer: Self-pay | Admitting: Obstetrics and Gynecology

## 2011-03-13 ENCOUNTER — Telehealth: Payer: Self-pay | Admitting: *Deleted

## 2011-03-13 MED ORDER — FUROSEMIDE 20 MG PO TABS: 10.0000 mg | ORAL_TABLET | Freq: Two times a day (BID) | ORAL | Status: DC

## 2011-03-13 NOTE — Telephone Encounter (Signed)
I sent in refill for rx, Please call pt to set up f/u apt with Dr Yetta Barre. THANKS

## 2011-03-13 NOTE — Op Note (Signed)
[facility]  Patient:    Angela, Wall Visit Number: [id number] MRN: [id number]          Service Type: DSU Location: [facility] Attending Physician:  Melony Overly Dictated by:   Angela Wall, M.D. Proc. Date: 09/30/01 Admit Date:  09/30/2001 Discharge Date: 09/30/2001                             Operative Report  PREOPERATIVE DIAGNOSIS:       Right lower quadrant pain.  POSTOPERATIVE DIAGNOSES:      1. Right lower quadrant pain.                               2. Pelvic adhesions.  OPERATION:                    Open diagnostic laparoscopy with lysis of adhesions.  SURGEON:                      Brook A. Edward Wall, M.D.  ANESTHESIA:                   General endotracheal anesthesia, local with 0.25% Marcaine.  ESTIMATED BLOOD LOSS:         Minimal.  IV FLUIDS:                    1600 cc Ringers lactate.  URINE OUTPUT:                 50 cc.  COMPLICATIONS:                None.  INDICATIONS FOR PROCEDURE:    The patient was a 45 year old, gravida 4, para 1-0-3-1, African-American female, status post bilateral salpingectomies for previous ectopic pregnancies, who presented with chronic right lower quadrant pain.  The patients last surgery was a laparoscopic right salpingectomy in March 2001 at which time a second ectopic pregnancy in the right fallopian tube had been diagnosed.  At the time of that surgery, the patient was noted to have adhesive disease which was treated.  The patient subsequently, following that procedure, did develop some intermittent right lower quadrant pain which became more persistent.  A pelvic ultrasound was noted to be unremarkable.  Oral contraceptive therapy and nonsteroidal anti-inflammatories did not ameliorate her pain, and a decision was, therefore, to proceed with the laparoscopic surgery after the risks, benefits and alternatives were reviewed with her.  FINDINGS:                     Laparoscopy  demonstrated omental adhesions which were adherent to the anterior abdominal wall from the level of the umbilicus to approximately midway to the pubic symphysis.  These adhesions were noted to be dense adhesions.  The uterus and ovaries were noted to be normal.  The fallopian tubes were absent bilaterally.  The appendix was normal.  There were some adhesions noted between the cecum and the right pelvic sidewall which were slightly thickened.  The upper abdomen demonstrated a normal liver in the absence of upper abdominal adhesions.  There was no evidence of any endometriosis in the abdomen or the pelvis.  SPECIMENS:                    None.  DESCRIPTION OF PROCEDURE:  With an IV in place, the patient was taken to the operating room after receiving 1 g of Ancef intravenously.  The patient was placed in a supine position on the operating table and general endotracheal anesthesia was induced.  The patient was then placed in the dorsal lithotomy position and the abdomen and vagina were sterilely prepped. A Foley catheter was sterilely placed inside the urinary bladder, and a speculum was placed inside the vagina.  A single-tooth tenaculum was initially placed on the anterior cervical lip, and this was replaced by a Hulka tenaculum prior to removing Wall of the other vaginal instruments.  The procedure began with an infraumbilical incision which was created sharply with a scalpel.  This was carried down to the fascia using a Kelly clamp.  The fascia was then grasped with two Kocher clamps and it was incised transversely.  The parietal peritoneum was then grasped and it was ultimately entered bluntly.  The Hasson cannula was next placed through the abdominal incision and into the peritoneal cavity.  The laparoscope confirmed proper placement.  A suture of 0 Vicryl was secured to the fascia on each apex of the incision and this was secured to the Hasson cannula.  The pneumoperitoneum  was achieved with CO2 gas and the patient was placed in the Trendelenburg position.  A 5 mm incision was then created in the right lower quadrant and a 5 mm trocar was inserted under visualization of the laparoscope.  A blunt tip probe was placed and an inspection of the pelvic and abdominal organs was performed.  The findings are as noted above.  Adhesiolysis began using a scissors both with and without monopolar cautery. To assist in the continuation of the adhesiolysis, a 5 mm laparoscope was placed through the right lower quadrant incision, and the umbilical trocar site was used for the laparoscopic monopolar scissors.  To complete the adhesiolysis, a 5 mm incision was created in the left lower quadrant and a 5 mm trocar was inserted, again under visualization of the laparoscope.  This allowed an additional trocar site for the 5 mm laparoscope alternating with a monopolar cautery scissors.  The adhesions were completely lysed from the abdominal wall.  The abdomen and pelvis were then irrigated and suctioned with crystalloid solution and Wall operative sites were reexamined.  There was no evidence of any bleeding.  Attention was then turned to the thickened adhesions between the cecum and the right pelvic sidewall which were lysed sharply with a scissors.  The pneumoperitoneum was then partially released and the operative sites were Wall reexamined.  There was no evidence of any ongoing bleeding.  The lower abdominal trocars were removed under visualization of the laparoscope.  The pneumoperitoneum was released, and the umbilical trocar sheath and laparoscope were removed simultaneously.  The sutures along the fascia were tied, and an additional through-and-through suture of 0 Vicryl was placed through the fascia for adequate closure of this layer.  Wall skin incisions were closed with subcuticular sutures of 3-0 plain.  Steri-Strips and Benzoin were placed over the incisions followed  by sterile bandages.  Wall of the remaining instruments were removed from the bladder and the vagina.  The patient was taken out of the dorsal lithotomy position.  She was awakened and extubated, and she was escorted to the recovery room in stable and awake condition.  There were no complications to the procedure.  Wall needle, instrument and sponge counts were correct.    DESCRIPTION OF PROCEDURE: Dictated by:  Brook A. Edward Wall, M.D. Attending Physician:  Melony Overly DD:  09/30/01 TD:  10/01/01 Job: [id number]

## 2011-03-13 NOTE — Op Note (Signed)
NAME:  Angela Wall, Angela Wall NO.:  [id number]   MEDICAL RECORD NO.:  [id number]          PATIENT TYPE:  AMB   LOCATION:  SDC                           FACILITY:  [facility]   PHYSICIAN:  Randye Lobo, M.D.   DATE OF BIRTH:  04-13-1966   DATE OF PROCEDURE:  11/12/2006  DATE OF DISCHARGE:                               OPERATIVE REPORT   PREOPERATIVE DIAGNOSIS:  Menometrorrhagia.   POSTOPERATIVE DIAGNOSIS:  Menometrorrhagia.   PROCEDURE:  Hysteroscopy, dilation and curettage, and NovaSure  endometrial ablation.   ESTIMATED BLOOD LOSS:  Minimal.   URINE OUTPUT:  By I&O catheterization prior to procedure.   Lactated ringers deficit 50 mL.   COMPLICATIONS:  None.   INDICATIONS FOR PROCEDURE:  The patient is a 45 year old gravida 5, para  1-0-4-1 African-American female that is status post bilateral  salpingectomy for a prior ectopic pregnancy with a history of prolonged  and heavy menses.  The patient has a history of hypertension and is  unable to take combined oral contraceptive pills.  The patient has been  taking Micronor which has not ameliorated her menstrual cycles.  The  patient is also having painful menses.  Ultrasound on November 01, 2006 in  the office documented a uterus with small extra cavitary fibroids  ranging from 0.7-1.81 cm.  The endometrial stripe was 0.54 cm, and the  ovaries were normal.  The patient declines any future childbearing, and  she requests an endometrial ablation.  A plan is made to proceed after  risks, benefits, and alternatives are reviewed.   FINDINGS:  Examination under anesthesia revealed a small, mid position,  mobile uterus.  No adnexal masses were appreciated.   Hysteroscopy demonstrated a normal endometrial cavity with no evidence  of any polyps or fibroids.  Both of the tubal ostia were visualized  well.   SPECIMENS:  Endometrial curettings were sent to pathology.   PROCEDURE:  The patient was re-identified in the  preoperative hold area.  She did receive Ancef 1 g IV for antibiotic prophylaxis.  In the  operating room, general endotracheal anesthesia was induced, and the  patient was placed in the dorsal lithotomy position.  The vagina and  perineum were sterilely prepped, and the bladder was catheterized of  urine.  The patient was then sterilely draped.   The procedure began with an examination under anesthesia.  A speculum  was then placed in the vagina, and a single-tooth tenaculum was placed  on the anterior cervical lip.  A paracervical block was performed with a  total of 10 mL of 1% lidocaine. The uterus was sounded to a total  distance of 8 cm.  The cervix needed to be dilated with the [facility]  dilators in order to accommodate the Hegar dilator to check the cervical  length which was measured at 3 cm.  The cervix was then further dilated  to accommodate the diagnostic hysteroscope which was inserted under the  continuous infusion of lactated Ringer's solution.  The findings are as  noted above.  The hysteroscope was then removed.  The cervix was dilated  to a #25  Pratt dilator.   The NovaSure device was then inserted through the cervical os to the  level of the uterine fundus and withdrawn slightly.  The device was  opened and locked into place, and then a series of maneuvers of rotation  and elevation superiorly and inferiorly was performed in order to  measure the cavity width which was measured at 4.4 cm.  The seal was  placed against the cervical os, and the CO2 test was performed, and the  patient passed the test.  The NovaSure was then performed over a total  of 54 seconds.  The ray at the end of the NovaSure was then brought back  into the obturator, and the device was removed from the uterine cavity.   The single-tooth tenaculum was removed, and the ring forceps was then  applied over the tenaculum site to control a small amount of oozing.  This provided good hemostasis.   This  concluded the patient's procedure.  There were no complications.  All needle, instrument, and sponge counts were correct.      Randye Lobo, M.D.  Electronically Signed     BES/MEDQ  D:  11/12/2006  T:  11/12/2006  Job:  [id number]

## 2011-03-13 NOTE — Op Note (Signed)
[facility] [place]  Patient:    Angela Wall, Angela Wall                   MRN: [id number] Adm. Date:  [id number] Disc. Date: [id number] Attending:  Conley Simmonds A                           Operative Report  PREOPERATIVE DIAGNOSES:       1. Right ectopic pregnancy.                               2. Infraumbilical incisional mass.  POSTOPERATIVE DIAGNOSES:      1. Aborting right ectopic pregnancy.                               2. Hemoperitoneum.                               3. Pelvic and abdominal adhesions.                               4. Umbilical incisional mass.  PROCEDURE:                    1. Examination under anesthesia.                               2. Dilation and evacuation.                               3. Laparoscopic right salpingectomy.                               4. Removal of hemoperitoneum.                               5. Lysis of adhesions.                               6. Excision of incisional mass.  SURGEON:                      Conley Simmonds, M.D.  ASSISTANT:                    Miguel Aschoff, M.D.  ANESTHESIA:                   General endotracheal, local with 0.25% Marcaine.  IV FLUIDS:                    2000 cc Ringers lactate.  ESTIMATED BLOOD LOSS:         250 cc of hemoperitoneum.  URINE OUTPUT:                 150 cc.  COMPLICATIONS:                None.  INDICATIONS:  Patient was a 45 year old gravida 4, para 1-0-2-1  African-American female with a last menstrual period November 10, 1999 who presented to the office with a complaint of bilateral lower abdominal discomfort of two weeks duration and a recent positive urine pregnancy test.  The patient had a history of two prior ectopic pregnancies.  She was status post left salpingectomy for a ruptured ectopic pregnancy in July 1998 and status post laparoscopic right linear salpingostomy for an ampullar ectopic pregnancy in July 1999.  The patients abdominal  examination was benign and a small 0.5 cm mass was noted at the site f a previous infraumbilical incision.  This was nontender.  Pelvic examination documented a six weeks size, anteverted, mobile, nontender uterus.  There was a  sense of right adnexal fullness, although there was no tenderness.  There was no evidence of left adnexal tenderness or fullness.  The patient did have a transvaginal ultrasound which documented no intrauterine pregnancy and only echogenic material seen within the uterus.  In the cul-de-sac on the right there was a cystic mass which measured 19.8 x 16.6 mm which was seen to be separate from the ovary.  This mass was suspicious for an ectopic pregnancy, although no yolk sac or fetus were seen.  No left adnexal mass was appreciated.  The patient did have serial beta HCGs and a beta HCG on March 5 was noted to be 5509 and a quantitative HCG on March 7 was noted to be 8002.  The patient was known to have a blood type O+.  The patient was given the diagnosis of a probable right ectopic pregnancy. A plan was made to proceed with a dilation and evacuation, right laparoscopic salpingectomy after the risks, benefits, and alternatives were reviewed with the patient.  The patient understood that a procedure to remove the right fallopian  tube would leave her unable to have future biologic childbearing without assisted reproductive technology and the patient accepted this.  FINDINGS:                     Examination under anesthesia revealed a small, anteverted, mobile uterus.  There was evidence of a right adnexal mass which was ill-defined and soft in nature.  There was no evidence of a left adnexal mass.  Along the infraumbilical incision there was a 0.5 cm firm subcutaneous mass appreciated.  Dilation and evacuation demonstrated a uterus that sounded to a total of 11.5 cm.  There was a moderate amount of tissue obtained from the uterine evacuation and no villi  were seen on frozen section.                                Laparoscopy demonstrated an aborting right ectopic pregnancy, the hemoperitoneum of 250 cc.  The left fallopian tube was absent. he ovaries and uterus were noted to be normal.  There were adhesions between the sigmoid colon mesentery and the left round ligament and these were lysed. There were also adhesions between the omentum and the anterior abdominal wall and these were lysed as well.  The appendix, liver, and stomach were all noted to be normal.  SPECIMENS:                    A frozen section of the uterine contents was performed.  No villi were seen.  The right fallopian tube and ectopic pregnancy  were sent to pathology as a  single specimen.  The infraumbilical incisional mass was also sent for evaluation.  PROCEDURE:                    With an IV in place the patient was taken to the operating room after she was properly identified.  She received general endotracheal anesthesia and she was then placed in the dorsal lithotomy position. The abdomen and vagina were sterilely prepped and draped.  The Foley catheter was sterilely placed inside the bladder.                                The procedure began with placement of the speculum inside the vagina and a single tooth tenaculum on the anterior cervical lip. The uterus was sounded and the cervix was then serially dilated to a # 27 Pratt dilator.  A # 7 suction curette tip was then introduced to the level of the fundus and the curette was withdrawn in a circular fashion to adequately remove the material inside the uterine cavity.  This was performed for a total of three times and the uterine cavity was then sharply curetted.  There was no evidence of any  remaining tissue.  The remaining blood was removed one last time using the suction curette.  A Hulka tenaculum was then placed on the anterior cervical lip.  The single tooth tenaculum was removed.                                 Attention was then turned to the abdomen where a 10 mm umbilical incision was created sharply with a scalpel.  This was carried own to the fascia using an Alice clamp.  A 10 mm trocar was inserted directly into he  abdominal cavity and the laparoscope confirmed proper placement.  A pneumoperitoneum was then achieved.  The findings are as noted above.                                The patient was placed in ______ position and a 10 mm trocar was placed in the right lower quadrant and a 5 mm trocar was placed in the left lower quadrant, each under direct visualization of the laparoscope. The procedure began with lysis of some of the omental adhesions along the anterior abdominal wall which were noted to be just below the level of the umbilicus and  extending down to the mid lower abdominal region.  When adequate access was obtained into the cul-de-sac the salpingectomy was performed.  The bipolar Clevenger forceps were used to come across the proximal portion of the right fallopian tube.  This was continued along the mesosalpinx of the fallopian tube in order that the ectopic pregnancy could be sharply excised using a scissors under hemostatic conditions.  The specimen was removed through the large right lower quadrant trocar and any remaining hemoperitoneum was removed after the abdomen as adequately irrigated and suctioned.                                The adhesions in the left adnexal region were then sharply lysed and the remaining adhesions along the anterior abdominal wall were removed using a combination of monopolar cautery, bipolar cautery, and sharp dissection.  The operating laparoscope was used to help accomplish this.  At the endoscopy of the procedure all operative sites were reexamined and there was no  evidence of any ongoing bleeding noted.  All of the trocars were removed after he pneumoperitoneum was released.                                 Attention was then turned to the infraumbilical incision where the mass was removed by extending the incision in the inferior direction.  Sharp dissection was used to remove the mass.  This was sent to pathology.                                The right lower quadrant incision was closed along the fascia with a running suture of 0 Vicryl.  All skin incisions were closed with subcuticular sutures of 3-0 Vicryl.  Steri-Strips were placed over the incisions followed by sterile bandages.                                The patient was taken out of the dorsal lithotomy  position, awakened, and extubated.  She was escorted to the recovery room in stable and awake condition.  There were no complications to the procedure.  All needle, instrument, and lap counts were correct. DD:  01/03/00 TD:  01/03/00 Job: [id number]

## 2011-03-30 ENCOUNTER — Other Ambulatory Visit (INDEPENDENT_AMBULATORY_CARE_PROVIDER_SITE_OTHER): Payer: BC Managed Care – PPO

## 2011-03-30 ENCOUNTER — Encounter: Payer: Self-pay | Admitting: Internal Medicine

## 2011-03-30 ENCOUNTER — Ambulatory Visit (INDEPENDENT_AMBULATORY_CARE_PROVIDER_SITE_OTHER): Payer: BC Managed Care – PPO | Admitting: Internal Medicine

## 2011-03-30 DIAGNOSIS — I10 Essential (primary) hypertension: Secondary | ICD-10-CM

## 2011-03-30 DIAGNOSIS — Z23 Encounter for immunization: Secondary | ICD-10-CM

## 2011-03-30 DIAGNOSIS — K219 Gastro-esophageal reflux disease without esophagitis: Secondary | ICD-10-CM

## 2011-03-30 LAB — CBC WITH DIFFERENTIAL/PLATELET
Basophils Absolute: 0.1 10*3/uL (ref 0.0–0.1)
Basophils Relative: 1.6 % (ref 0.0–3.0)
Eosinophils Absolute: 0.1 10*3/uL (ref 0.0–0.7)
HCT: 37.9 % (ref 36.0–46.0)
Hemoglobin: 13 g/dL (ref 12.0–15.0)
Lymphocytes Relative: 30.3 % (ref 12.0–46.0)
Lymphs Abs: 2.5 10*3/uL (ref 0.7–4.0)
MCHC: 34.2 g/dL (ref 30.0–36.0)
Monocytes Relative: 4.1 % (ref 3.0–12.0)
Neutro Abs: 5.2 10*3/uL (ref 1.4–7.7)
RBC: 4.34 Mil/uL (ref 3.87–5.11)
RDW: 13.1 % (ref 11.5–14.6)

## 2011-03-30 LAB — COMPREHENSIVE METABOLIC PANEL
ALT: 13 U/L (ref 0–35)
BUN: 9 mg/dL (ref 6–23)
CO2: 27 mEq/L (ref 19–32)
Calcium: 8.9 mg/dL (ref 8.4–10.5)
Chloride: 105 mEq/L (ref 96–112)
Creatinine, Ser: 0.7 mg/dL (ref 0.4–1.2)
GFR: 127.93 mL/min (ref >60.00)
Total Bilirubin: 1.6 mg/dL — ABNORMAL HIGH (ref 0.3–1.2)

## 2011-03-30 NOTE — Assessment & Plan Note (Signed)
I will check labs today. 

## 2011-03-30 NOTE — Progress Notes (Signed)
Subjective:    Patient ID: Angela Wall, female    DOB: Sep 30, 1966, 45 y.o.   MRN: [id number]  Hypertension This is a chronic problem. The current episode started more than 1 year ago. The problem has been gradually improving since onset. The problem is controlled. Pertinent negatives include no anxiety, blurred vision, chest pain, headaches, malaise/fatigue, neck pain, orthopnea, palpitations, peripheral edema, PND, shortness of breath or sweats. There are no associated agents to hypertension. Past treatments include beta blockers. The current treatment provides moderate improvement. There are no compliance problems.       Review of Systems  Constitutional: Negative for fever, chills, malaise/fatigue, diaphoresis, activity change, appetite change, fatigue and unexpected weight change.  HENT: Negative for facial swelling, trouble swallowing, neck pain and voice change.   Eyes: Negative for blurred vision, photophobia and visual disturbance.  Respiratory: Negative for apnea, cough, shortness of breath and wheezing.   Cardiovascular: Negative for chest pain, palpitations, orthopnea, leg swelling and PND.  Gastrointestinal: Negative for nausea, vomiting, abdominal pain, diarrhea, constipation and blood in stool.  Genitourinary: Negative for dysuria, urgency, frequency, hematuria, decreased urine volume, enuresis, difficulty urinating and dyspareunia.  Musculoskeletal: Negative for myalgias, back pain, joint swelling, arthralgias and gait problem.  Skin: Negative for color change, pallor, rash and wound.  Neurological: Negative for dizziness, tremors, seizures, syncope, facial asymmetry, speech difficulty, weakness, light-headedness, numbness and headaches.  Hematological: Negative for adenopathy. Does not bruise/bleed easily.  Psychiatric/Behavioral: Negative for suicidal ideas, hallucinations, behavioral problems, confusion, sleep disturbance, self-injury, dysphoric mood, decreased  concentration and agitation. The patient is not nervous/anxious and is not hyperactive.        Objective:   Physical Exam  Constitutional: She is oriented to person, place, and time. She appears well-developed and well-nourished. No distress.  HENT:  Head: Normocephalic and atraumatic.  Right Ear: External ear normal.  Left Ear: External ear normal.  Nose: Nose normal.  Mouth/Throat: Oropharynx is clear and moist. No oropharyngeal exudate.  Eyes: Conjunctivae and EOM are normal. Pupils are equal, round, and reactive to light. Right eye exhibits no discharge. Left eye exhibits no discharge. No scleral icterus.  Neck: Normal range of motion. Neck supple. No JVD present. No tracheal deviation present. No thyromegaly present.  Cardiovascular: Normal rate, regular rhythm, normal heart sounds and intact distal pulses.  Exam reveals no gallop and no friction rub.   No murmur heard. Pulmonary/Chest: Effort normal and breath sounds normal. No stridor. No respiratory distress. She has no wheezes. She has no rales. She exhibits no tenderness.  Abdominal: Soft. Bowel sounds are normal. She exhibits no distension and no mass. There is no tenderness. There is no rebound and no guarding.  Musculoskeletal: Normal range of motion. She exhibits no edema and no tenderness.  Lymphadenopathy:    She has no cervical adenopathy.  Neurological: She is alert and oriented to person, place, and time. She has normal reflexes. She displays normal reflexes. No cranial nerve deficit. She exhibits normal muscle tone. Coordination normal.  Skin: Skin is warm and dry. No rash noted. She is not diaphoretic. No erythema. No pallor.  Psychiatric: She has a normal mood and affect. Her behavior is normal. Judgment and thought content normal.         Lab Results  Component Value Date   WBC 8.2 03/30/2011   HGB 13.0 03/30/2011   HCT 37.9 03/30/2011   PLT 220.0 03/30/2011   ALT 13 03/30/2011   AST 14 03/30/2011   NA 138 03/30/2011  K 4.3 03/30/2011   CL 105 03/30/2011   CREATININE 0.7 03/30/2011   BUN 9 03/30/2011   CO2 27 03/30/2011   TSH 1.45 03/30/2011   Assessment & Plan:

## 2011-04-09 LAB — HM MAMMOGRAPHY: HM Mammogram: NORMAL

## 2011-04-14 ENCOUNTER — Telehealth: Payer: Self-pay

## 2011-04-14 MED ORDER — FUROSEMIDE 20 MG PO TABS: 10.0000 mg | ORAL_TABLET | Freq: Two times a day (BID) | ORAL | Status: DC

## 2011-04-21 NOTE — Telephone Encounter (Signed)
error 

## 2011-04-30 NOTE — Telephone Encounter (Signed)
Pt was seen on 6/4

## 2011-08-12 ENCOUNTER — Other Ambulatory Visit: Payer: Self-pay | Admitting: Internal Medicine

## 2011-08-28 ENCOUNTER — Other Ambulatory Visit: Payer: Self-pay | Admitting: Internal Medicine

## 2012-02-26 ENCOUNTER — Other Ambulatory Visit: Payer: Self-pay | Admitting: Internal Medicine

## 2012-02-26 MED ORDER — ATENOLOL 50 MG PO TABS: 50.0000 mg | ORAL_TABLET | Freq: Every day | ORAL | Status: DC

## 2012-02-26 NOTE — Telephone Encounter (Signed)
Pt request atenolol 50 mg--[facility]--pt ph# [phone]

## 2012-02-26 NOTE — Telephone Encounter (Signed)
She needs to be seen.

## 2012-02-26 NOTE — Telephone Encounter (Signed)
Patient LMOVM stating that las tablet was taken today and will need enough to last until appt. RX sent ot pharmacy per pt request

## 2012-02-26 NOTE — Telephone Encounter (Signed)
Gave pt appt w/Dr Yetta Barre:  03/14/12 @ 145p--phone

## 2012-03-14 ENCOUNTER — Ambulatory Visit (INDEPENDENT_AMBULATORY_CARE_PROVIDER_SITE_OTHER): Payer: BC Managed Care – PPO | Admitting: Internal Medicine

## 2012-03-14 ENCOUNTER — Other Ambulatory Visit (INDEPENDENT_AMBULATORY_CARE_PROVIDER_SITE_OTHER): Payer: BC Managed Care – PPO

## 2012-03-14 ENCOUNTER — Encounter: Payer: Self-pay | Admitting: Internal Medicine

## 2012-03-14 VITALS — BP 146/90 | HR 88 | Temp 98.3°F | Resp 16 | Wt 146.0 lb

## 2012-03-14 DIAGNOSIS — E7800 Pure hypercholesterolemia, unspecified: Secondary | ICD-10-CM

## 2012-03-14 DIAGNOSIS — R7309 Other abnormal glucose: Secondary | ICD-10-CM

## 2012-03-14 DIAGNOSIS — I10 Essential (primary) hypertension: Secondary | ICD-10-CM

## 2012-03-14 DIAGNOSIS — M5412 Radiculopathy, cervical region: Secondary | ICD-10-CM | POA: Insufficient documentation

## 2012-03-14 HISTORY — DX: Pure hypercholesterolemia, unspecified: E78.00

## 2012-03-14 HISTORY — DX: Other abnormal glucose: R73.09

## 2012-03-14 LAB — LIPID PANEL
HDL: 56.8 mg/dL (ref >39.00)
LDL Cholesterol: 71 mg/dL (ref 0–99)
Total CHOL/HDL Ratio: 2
Triglycerides: 65 mg/dL (ref 0.0–149.0)
VLDL: 13 mg/dL (ref 0.0–40.0)

## 2012-03-14 LAB — COMPREHENSIVE METABOLIC PANEL
BUN: 8 mg/dL (ref 6–23)
CO2: 26 mEq/L (ref 19–32)
Calcium: 9 mg/dL (ref 8.4–10.5)
Chloride: 103 mEq/L (ref 96–112)
Creatinine, Ser: 0.7 mg/dL (ref 0.4–1.2)
GFR: 120.9 mL/min (ref >60.00)
Glucose, Bld: 87 mg/dL (ref 70–99)

## 2012-03-14 LAB — CBC WITH DIFFERENTIAL/PLATELET
Basophils Relative: 0.6 % (ref 0.0–3.0)
HCT: 38.8 % (ref 36.0–46.0)
Hemoglobin: 13.1 g/dL (ref 12.0–15.0)
Lymphocytes Relative: 31 % (ref 12.0–46.0)
MCHC: 33.7 g/dL (ref 30.0–36.0)
Monocytes Relative: 5.8 % (ref 3.0–12.0)
Neutro Abs: 5.9 10*3/uL (ref 1.4–7.7)
RBC: 4.5 Mil/uL (ref 3.87–5.11)

## 2012-03-14 LAB — HEMOGLOBIN A1C: Hgb A1c MFr Bld: 5.6 % (ref 4.6–6.5)

## 2012-03-14 MED ORDER — NEBIVOLOL HCL 5 MG PO TABS: 5.0000 mg | ORAL_TABLET | Freq: Every day | ORAL | Status: DC

## 2012-03-14 NOTE — Assessment & Plan Note (Signed)
I will check her FLP and TSH today 

## 2012-03-14 NOTE — Assessment & Plan Note (Signed)
Her BP is not well controlled so I have changed the atenolol to bystolic and she will continue the lasix, I will check her lytes and renal function today

## 2012-03-14 NOTE — Patient Instructions (Signed)

## 2012-03-14 NOTE — Progress Notes (Signed)
  Subjective:    Patient ID: Angela Wall, female    DOB: Jul 17, 1966, 46 y.o.   MRN: [id number]  Hypertension This is a chronic problem. The current episode started more than 1 year ago. The problem has been gradually worsening since onset. The problem is uncontrolled. Pertinent negatives include no anxiety, blurred vision, chest pain, headaches, malaise/fatigue, neck pain, orthopnea, palpitations, peripheral edema, PND, shortness of breath or sweats. Past treatments include diuretics and beta blockers. The current treatment provides moderate improvement. Compliance problems include exercise and diet.       Review of Systems  Constitutional: Negative for fever, chills, malaise/fatigue, diaphoresis, activity change, appetite change, fatigue and unexpected weight change.  HENT: Negative.  Negative for neck pain.   Eyes: Negative.  Negative for blurred vision.  Respiratory: Negative for apnea, cough, choking, chest tightness, shortness of breath, wheezing and stridor.   Cardiovascular: Negative for chest pain, palpitations, orthopnea, leg swelling and PND.  Gastrointestinal: Negative for nausea, vomiting, abdominal pain, diarrhea, constipation and blood in stool.  Genitourinary: Negative.   Musculoskeletal: Negative for myalgias, back pain, joint swelling, arthralgias and gait problem.  Skin: Negative for color change, pallor, rash and wound.  Neurological: Negative for dizziness, tremors, seizures, syncope, facial asymmetry, speech difficulty, weakness, light-headedness, numbness and headaches.  Hematological: Negative for adenopathy. Does not bruise/bleed easily.  Psychiatric/Behavioral: Negative.        Objective:   Physical Exam  Vitals reviewed. Constitutional: She is oriented to person, place, and time. She appears well-developed and well-nourished. No distress.  HENT:  Head: Normocephalic and atraumatic.  Mouth/Throat: Oropharynx is clear and moist. No oropharyngeal exudate.    Eyes: Conjunctivae are normal. Right eye exhibits no discharge. Left eye exhibits no discharge. No scleral icterus.  Neck: Normal range of motion. Neck supple. No JVD present. No tracheal deviation present. No thyromegaly present.  Cardiovascular: Normal rate, regular rhythm, normal heart sounds and intact distal pulses.  Exam reveals no gallop and no friction rub.   No murmur heard. Pulmonary/Chest: Effort normal and breath sounds normal. No stridor. No respiratory distress. She has no wheezes. She has no rales. She exhibits no tenderness.  Abdominal: Soft. Bowel sounds are normal. She exhibits no distension and no mass. There is no tenderness. There is no rebound and no guarding.  Musculoskeletal: Normal range of motion. She exhibits no edema and no tenderness.  Lymphadenopathy:    She has no cervical adenopathy.  Neurological: She is oriented to person, place, and time.  Skin: Skin is warm and dry. No rash noted. She is not diaphoretic. No erythema. No pallor.  Psychiatric: She has a normal mood and affect. Her behavior is normal. Judgment and thought content normal.      Lab Results  Component Value Date   WBC 8.2 03/30/2011   HGB 13.0 03/30/2011   HCT 37.9 03/30/2011   PLT 220.0 03/30/2011   GLUCOSE 111* 03/30/2011   ALT 13 03/30/2011   AST 14 03/30/2011   NA 138 03/30/2011   K 4.3 03/30/2011   CL 105 03/30/2011   CREATININE 0.7 03/30/2011   BUN 9 03/30/2011   CO2 27 03/30/2011   TSH 1.45 03/30/2011      Assessment & Plan:

## 2012-03-14 NOTE — Assessment & Plan Note (Signed)
I will check her a1c today to see if she has developed DM II 

## 2012-03-16 ENCOUNTER — Encounter: Payer: Self-pay | Admitting: Internal Medicine

## 2012-05-27 ENCOUNTER — Telehealth: Payer: Self-pay | Admitting: Internal Medicine

## 2012-05-27 DIAGNOSIS — I10 Essential (primary) hypertension: Secondary | ICD-10-CM

## 2012-05-27 NOTE — Telephone Encounter (Signed)
Pt called saying she called yesterday for Bystolic.  She is out.  She had samples from last OV.  She wants samples or an RX.  I made her an appt. For next week to FU with Dr. Yetta Barre per the instructions on the last visit.  She wants a nurse to call her to tell her if she needs to come.  She only wants to come yearly.  I did explain that some medications may require more frequent follow ups if the doctor requests that.  Please call her.

## 2012-05-29 MED ORDER — NEBIVOLOL HCL 5 MG PO TABS: 5.0000 mg | ORAL_TABLET | Freq: Every day | ORAL | Status: DC

## 2012-05-29 NOTE — Telephone Encounter (Signed)
Yes, she needs to be seen for f/up

## 2012-06-02 ENCOUNTER — Ambulatory Visit: Payer: BC Managed Care – PPO | Admitting: Internal Medicine

## 2012-06-06 NOTE — Telephone Encounter (Signed)
Pt. Cancelled her appt. That was on Aug 8.

## 2012-07-08 ENCOUNTER — Telehealth: Payer: Self-pay | Admitting: Internal Medicine

## 2012-07-08 DIAGNOSIS — I10 Essential (primary) hypertension: Secondary | ICD-10-CM

## 2012-07-08 MED ORDER — CARVEDILOL 6.25 MG PO TABS: 6.2500 mg | ORAL_TABLET | Freq: Two times a day (BID) | ORAL | Status: DC

## 2012-07-08 NOTE — Telephone Encounter (Signed)
Caller: Ammarie/Patient; Patient Name: Angela Wall; PCP: Sanda Linger (Adults only); Best Callback Phone Number: [phone]. LMP- pt had endometrial ablation.  Call regarding: Pt can't afford the Bystolic and wants to an alternative to the Bystolic. Pt finished samples of Bystolic and went to fill script and the medication is too expensive even with insurance.  Pt is now out of blood pressure medication and is experiencing a mild headache.   Please send [facility] to [facility] [address] [phone].   All emergent symptoms ruled out per Headache protocol with excpetion to 'Symptoms began aftter starting or changing  dose of prescription, nonprescription, alternative medication, or illicit drug'.  See Provider in 24 hrs.  Pt declined appt. Pt leaving on vacation and will be back on Tues.  PLEASE FOLLOW UP WITH PT REGADING BP MEDICATION/SCRIPT. Thank you.

## 2012-07-08 NOTE — Telephone Encounter (Signed)
Pt informed of new rx for BP. Rx sent to [facility] on [address] per pt's request.

## 2012-07-08 NOTE — Telephone Encounter (Signed)
done

## 2012-08-20 ENCOUNTER — Other Ambulatory Visit: Payer: Self-pay | Admitting: Internal Medicine

## 2012-10-17 ENCOUNTER — Other Ambulatory Visit: Payer: Self-pay | Admitting: Internal Medicine

## 2012-10-18 ENCOUNTER — Telehealth: Payer: Self-pay | Admitting: Internal Medicine

## 2012-10-18 ENCOUNTER — Other Ambulatory Visit: Payer: Self-pay

## 2012-10-18 MED ORDER — FUROSEMIDE 20 MG PO TABS: 10.0000 mg | ORAL_TABLET | Freq: Two times a day (BID) | ORAL | Status: DC

## 2012-10-18 NOTE — Telephone Encounter (Signed)
Patient/Angela Wall calling to check on her Lasix that should have been called into [facility].  Checked with pharmacist at [facility] and read the order to pharmacist.

## 2012-10-27 ENCOUNTER — Ambulatory Visit: Payer: BC Managed Care – PPO | Admitting: Internal Medicine

## 2012-11-02 ENCOUNTER — Telehealth: Payer: Self-pay | Admitting: Internal Medicine

## 2012-11-02 NOTE — Telephone Encounter (Signed)
PCP change request per patient would like to switch here for personal reasons, if all are ok I can call back to set up NPE/CPE Cb# [phone]

## 2012-11-02 NOTE — Telephone Encounter (Signed)
Ok w/ me 

## 2012-11-02 NOTE — Telephone Encounter (Signed)
OK with me.

## 2012-11-02 NOTE — Telephone Encounter (Signed)
Called pt at number listed scheduled for 1.15.14 at 8:30am

## 2012-11-09 ENCOUNTER — Ambulatory Visit (INDEPENDENT_AMBULATORY_CARE_PROVIDER_SITE_OTHER): Payer: BC Managed Care – PPO | Admitting: Family Medicine

## 2012-11-09 ENCOUNTER — Telehealth: Payer: Self-pay | Admitting: Family Medicine

## 2012-11-09 ENCOUNTER — Encounter: Payer: Self-pay | Admitting: Family Medicine

## 2012-11-09 VITALS — BP 130/100 | HR 81 | Temp 99.2°F | Ht 60.0 in | Wt 152.8 lb

## 2012-11-09 DIAGNOSIS — I10 Essential (primary) hypertension: Secondary | ICD-10-CM

## 2012-11-09 DIAGNOSIS — M62838 Other muscle spasm: Secondary | ICD-10-CM | POA: Insufficient documentation

## 2012-11-09 DIAGNOSIS — Z0000 Encounter for general adult medical examination without abnormal findings: Secondary | ICD-10-CM | POA: Insufficient documentation

## 2012-11-09 LAB — CBC WITH DIFFERENTIAL/PLATELET
Basophils Absolute: 0 10*3/uL (ref 0.0–0.1)
Eosinophils Absolute: 0.2 10*3/uL (ref 0.0–0.7)
HCT: 38.2 % (ref 36.0–46.0)
Hemoglobin: 12.9 g/dL (ref 12.0–15.0)
Lymphs Abs: 2.3 10*3/uL (ref 0.7–4.0)
MCHC: 33.8 g/dL (ref 30.0–36.0)
Monocytes Absolute: 0.4 10*3/uL (ref 0.1–1.0)
Monocytes Relative: 4.7 % (ref 3.0–12.0)
Neutro Abs: 4.7 10*3/uL (ref 1.4–7.7)
Platelets: 226 10*3/uL (ref 150.0–400.0)
RDW: 13.2 % (ref 11.5–14.6)

## 2012-11-09 LAB — LIPID PANEL
Cholesterol: 152 mg/dL (ref 0–200)
HDL: 52.4 mg/dL (ref >39.00)
LDL Cholesterol: 85 mg/dL (ref 0–99)
Total CHOL/HDL Ratio: 3
Triglycerides: 73 mg/dL (ref 0.0–149.0)

## 2012-11-09 LAB — HEPATIC FUNCTION PANEL
Alkaline Phosphatase: 66 U/L (ref 39–117)
Bilirubin, Direct: 0.1 mg/dL (ref 0.0–0.3)
Total Bilirubin: 1.3 mg/dL — ABNORMAL HIGH (ref 0.3–1.2)
Total Protein: 7 g/dL (ref 6.0–8.3)

## 2012-11-09 LAB — BASIC METABOLIC PANEL
Calcium: 8.8 mg/dL (ref 8.4–10.5)
Creatinine, Ser: 0.8 mg/dL (ref 0.4–1.2)
GFR: 104.43 mL/min (ref >60.00)
Sodium: 137 mEq/L (ref 135–145)

## 2012-11-09 MED ORDER — NAPROXEN 500 MG PO TABS: 500.0000 mg | ORAL_TABLET | Freq: Two times a day (BID) | ORAL | Status: DC

## 2012-11-09 MED ORDER — LOSARTAN POTASSIUM 100 MG PO TABS: 100.0000 mg | ORAL_TABLET | Freq: Every day | ORAL | Status: DC

## 2012-11-09 MED ORDER — CYCLOBENZAPRINE HCL 10 MG PO TABS: 10.0000 mg | ORAL_TABLET | Freq: Three times a day (TID) | ORAL | Status: DC | PRN

## 2012-11-09 NOTE — Telephone Encounter (Signed)
pt called stated she needs Lasix [facility]--pt was seen today for CPE   [id number] - [place], [place] - [address]

## 2012-11-09 NOTE — Progress Notes (Signed)
  Subjective:    Patient ID: Angela Wall, female    DOB: [birth year], 47 y.o.   MRN: [id number]  HPI CPE- new to this office.  UTD GYN- [facility] Ramp.  HTN- chronic problem, forgets to take 2nd dose of Coreg.  Also on Lasix.  dx'd ~3 yrs ago.  No CP, SOB, HAs, visual changes, edema.  R shoulder pain- 2 weeks, difficulty getting comfortable- particularly at night.  Pain w/ raising R arm.   Review of Systems Patient reports no vision/ hearing changes, adenopathy,fever, weight change,  persistant/recurrent hoarseness , swallowing issues, chest pain, palpitations, edema, persistant/recurrent cough, hemoptysis, dyspnea (rest/exertional/paroxysmal nocturnal), gastrointestinal bleeding (melena, rectal bleeding), abdominal pain, significant heartburn, bowel changes, GU symptoms (dysuria, hematuria, incontinence), Gyn symptoms (abnormal  bleeding, pain),  syncope, focal weakness, memory loss, numbness & tingling, skin/hair/nail changes, abnormal bruising or bleeding, anxiety, or depression.     Objective:   Physical Exam General Appearance:    Alert, cooperative, no distress, appears stated age  Head:    Normocephalic, without obvious abnormality, atraumatic  Eyes:    PERRL, conjunctiva/corneas clear, EOM's intact, fundi    benign, both eyes  Ears:    Normal TM's and external ear canals, both ears  Nose:   Nares normal, septum midline, mucosa normal, no drainage    or sinus tenderness  Throat:   Lips, mucosa, and tongue normal; teeth and gums normal  Neck:   Supple, symmetrical, trachea midline, no adenopathy;    Thyroid: no enlargement/tenderness/nodules  Back:     Symmetric, no curvature, ROM normal, no CVA tenderness.  + R trap spasm  Lungs:     Clear to auscultation bilaterally, respirations unlabored  Chest Wall:    No tenderness or deformity   Heart:    Regular rate and rhythm, S1 and S2 normal, no murmur, rub   or gallop  Breast Exam:    Deferred to GYN  Abdomen:     Soft,  non-tender, bowel sounds active all four quadrants,    no masses, no organomegaly  Genitalia:    Deferred to GYN  Rectal:    Extremities:   Extremities normal, atraumatic, no cyanosis or edema  Pulses:   2+ and symmetric all extremities  Skin:   Skin color, texture, turgor normal, no rashes or lesions  Lymph nodes:   Cervical, supraclavicular, and axillary nodes normal  Neurologic:   CNII-XII intact, normal strength, sensation and reflexes    throughout          Assessment & Plan:

## 2012-11-09 NOTE — Patient Instructions (Addendum)
Follow up in 1 month to recheck BP STOP the coreg START the Losartan Continue the lasix Use the Naproxen twice daily- w/ food- for 7-10 days and then as needed Use the Flexeril at night for muscle spasm HEAT!! Call with any questions or concerns Welcome!  We're glad to have you!!!

## 2012-11-10 MED ORDER — FUROSEMIDE 20 MG PO TABS: 10.0000 mg | ORAL_TABLET | Freq: Two times a day (BID) | ORAL | Status: DC

## 2012-11-10 NOTE — Telephone Encounter (Signed)
pt called to check status because pt os out--cb# [phone]

## 2012-11-10 NOTE — Telephone Encounter (Signed)
Ok for #90, 3 refills 

## 2012-11-10 NOTE — Telephone Encounter (Signed)
Please advise on RF request.//AB/CMA 

## 2012-11-11 ENCOUNTER — Encounter: Payer: Self-pay | Admitting: Family Medicine

## 2012-11-11 ENCOUNTER — Other Ambulatory Visit: Payer: Self-pay | Admitting: *Deleted

## 2012-11-11 DIAGNOSIS — I10 Essential (primary) hypertension: Secondary | ICD-10-CM

## 2012-11-11 MED ORDER — FUROSEMIDE 20 MG PO TABS: 10.0000 mg | ORAL_TABLET | Freq: Two times a day (BID) | ORAL | Status: DC

## 2012-11-14 NOTE — Assessment & Plan Note (Addendum)
New to provider, chronic for pt.  Not at goal.  Stop Coreg and start ARB.  Check labs.  Will follow closely.

## 2012-11-14 NOTE — Assessment & Plan Note (Signed)
New.  Start scheduled NSAIDs and muscle relaxers prn.  Heat.  Reviewed supportive care and red flags that should prompt return.  Pt expressed understanding and is in agreement w/ plan.

## 2012-11-14 NOTE — Assessment & Plan Note (Signed)
Pt's PE WNL w/ exception of HTN and trap spasm.  UTD on GYN.  Check labs.  Anticipatory guidance provided.

## 2012-11-15 LAB — VITAMIN D 1,25 DIHYDROXY: Vitamin D2 1, 25 (OH)2: <8 pg/mL

## 2013-07-02 ENCOUNTER — Other Ambulatory Visit: Payer: Self-pay | Admitting: Family Medicine

## 2013-07-03 NOTE — Telephone Encounter (Signed)
Rx filled #30, 0 Refills. Pt made aware that she would need an appointment for refills. SW, CMA

## 2013-08-31 ENCOUNTER — Other Ambulatory Visit: Payer: Self-pay

## 2013-09-15 ENCOUNTER — Other Ambulatory Visit: Payer: Self-pay | Admitting: Internal Medicine

## 2013-10-06 ENCOUNTER — Telehealth: Payer: Self-pay | Admitting: Family Medicine

## 2013-10-06 MED ORDER — LOSARTAN POTASSIUM 100 MG PO TABS: ORAL_TABLET | ORAL | Status: DC

## 2013-10-06 NOTE — Telephone Encounter (Signed)
Med filled #30 with 0 

## 2013-10-06 NOTE — Telephone Encounter (Signed)
Pt.notified

## 2013-10-06 NOTE — Telephone Encounter (Signed)
Ok for #30, no refills 

## 2013-10-06 NOTE — Telephone Encounter (Signed)
Patient is calling to request a refill on her Losartan rx. She states that she is unable to come in for an OV until January because she does not have any money on her flex card. Please advise.

## 2013-10-27 ENCOUNTER — Encounter: Payer: Self-pay | Admitting: Family Medicine

## 2013-11-03 ENCOUNTER — Encounter: Payer: Self-pay | Admitting: Family Medicine

## 2013-11-03 ENCOUNTER — Ambulatory Visit (INDEPENDENT_AMBULATORY_CARE_PROVIDER_SITE_OTHER): Payer: BC Managed Care – PPO | Admitting: Family Medicine

## 2013-11-03 VITALS — BP 140/82 | HR 90 | Temp 98.2°F | Resp 16 | Wt 160.2 lb

## 2013-11-03 DIAGNOSIS — I10 Essential (primary) hypertension: Secondary | ICD-10-CM

## 2013-11-03 LAB — CBC WITH DIFFERENTIAL/PLATELET
BASOS ABS: 0 10*3/uL (ref 0.0–0.1)
Basophils Relative: 0.5 % (ref 0.0–3.0)
Eosinophils Absolute: 0.1 10*3/uL (ref 0.0–0.7)
Eosinophils Relative: 1.8 % (ref 0.0–5.0)
HCT: 37.7 % (ref 36.0–46.0)
Hemoglobin: 12.8 g/dL (ref 12.0–15.0)
Lymphocytes Relative: 27.7 % (ref 12.0–46.0)
Lymphs Abs: 2.2 10*3/uL (ref 0.7–4.0)
MCHC: 33.9 g/dL (ref 30.0–36.0)
MCV: 84.9 fl (ref 78.0–100.0)
Monocytes Absolute: 0.4 10*3/uL (ref 0.1–1.0)
Monocytes Relative: 4.9 % (ref 3.0–12.0)
Neutro Abs: 5.2 10*3/uL (ref 1.4–7.7)
Neutrophils Relative %: 65.1 % (ref 43.0–77.0)
PLATELETS: 252 10*3/uL (ref 150.0–400.0)
RBC: 4.44 Mil/uL (ref 3.87–5.11)
RDW: 13.9 % (ref 11.5–14.6)
WBC: 8 10*3/uL (ref 4.5–10.5)

## 2013-11-03 LAB — HEPATIC FUNCTION PANEL
ALBUMIN: 4 g/dL (ref 3.5–5.2)
ALT: 13 U/L (ref 0–35)
AST: 15 U/L (ref 0–37)
Alkaline Phosphatase: 73 U/L (ref 39–117)
Bilirubin, Direct: 0 mg/dL (ref 0.0–0.3)
Total Bilirubin: 0.9 mg/dL (ref 0.3–1.2)
Total Protein: 7.1 g/dL (ref 6.0–8.3)

## 2013-11-03 LAB — BASIC METABOLIC PANEL
BUN: 11 mg/dL (ref 6–23)
CO2: 26 meq/L (ref 19–32)
Calcium: 9 mg/dL (ref 8.4–10.5)
Chloride: 105 mEq/L (ref 96–112)
Creatinine, Ser: 0.7 mg/dL (ref 0.4–1.2)
GFR: 108.85 mL/min (ref >60.00)
Glucose, Bld: 96 mg/dL (ref 70–99)
POTASSIUM: 3.6 meq/L (ref 3.5–5.1)
SODIUM: 137 meq/L (ref 135–145)

## 2013-11-03 LAB — LIPID PANEL
CHOLESTEROL: 156 mg/dL (ref 0–200)
HDL: 46.2 mg/dL (ref >39.00)
LDL Cholesterol: 94 mg/dL (ref 0–99)
Total CHOL/HDL Ratio: 3
Triglycerides: 78 mg/dL (ref 0.0–149.0)
VLDL: 15.6 mg/dL (ref 0.0–40.0)

## 2013-11-03 LAB — TSH: TSH: 0.82 u[IU]/mL (ref 0.35–5.50)

## 2013-11-03 MED ORDER — LOSARTAN POTASSIUM 100 MG PO TABS: ORAL_TABLET | ORAL | Status: DC

## 2013-11-03 NOTE — Progress Notes (Signed)
   Subjective:    Patient ID: Angela Wall, female    DOB: 01-31-1966, 48 y.o.   MRN: [id number]  HPI HTN- chronic problem, on Losartan and lasix daily.  Pt reports at end of last year BP was elevated due to stress.  No CP, SOB, HAs, visual changes, edema.   Review of Systems For ROS see HPI     Objective:   Physical Exam  Vitals reviewed. Constitutional: She is oriented to person, place, and time. She appears well-developed and well-nourished. No distress.  HENT:  Head: Normocephalic and atraumatic.  Eyes: Conjunctivae and EOM are normal. Pupils are equal, round, and reactive to light.  Neck: Normal range of motion. Neck supple. No thyromegaly present.  Cardiovascular: Normal rate, regular rhythm, normal heart sounds and intact distal pulses.   No murmur heard. Pulmonary/Chest: Effort normal and breath sounds normal. No respiratory distress.  Abdominal: Soft. She exhibits no distension. There is no tenderness.  Musculoskeletal: She exhibits no edema.  Lymphadenopathy:    She has no cervical adenopathy.  Neurological: She is alert and oriented to person, place, and time.  Skin: Skin is warm and dry.  Psychiatric: She has a normal mood and affect. Her behavior is normal.          Assessment & Plan:

## 2013-11-03 NOTE — Progress Notes (Signed)
Pre visit review using our clinic review tool, if applicable. No additional management support is needed unless otherwise documented below in the visit note. 

## 2013-11-03 NOTE — Patient Instructions (Signed)
Schedule your complete physical at your convenience We'll notify you of your lab results and make any changes if needed Keep up the good work!  You look great! Call with any questions or concerns Happy New Year!!!

## 2013-11-03 NOTE — Assessment & Plan Note (Signed)
Adequate control.  Asymptomatic.  Check labs.  Reviewed dietary and lifestyle modifications (exercise, Na restriction) to improve BP.  Pt expressed understanding and is in agreement w/ plan.

## 2013-11-13 ENCOUNTER — Encounter: Payer: Self-pay | Admitting: Family Medicine

## 2014-01-15 ENCOUNTER — Telehealth: Payer: Self-pay | Admitting: *Deleted

## 2014-01-15 NOTE — Telephone Encounter (Signed)
Patient called requesting an appt today. She states she is experiencing diarrhea and nausea since yesterday. JG//CMA

## 2014-01-15 NOTE — Telephone Encounter (Signed)
Called Patient back to schedule her an appointment with one of our other offices but she did not want to go. I informed patient that she also can go to other urgent cares like [facility]. Patient still denied and stated that she will check back with us tomorrow.

## 2014-02-01 ENCOUNTER — Other Ambulatory Visit: Payer: Self-pay | Admitting: Family Medicine

## 2014-02-01 NOTE — Telephone Encounter (Signed)
Med filled.  

## 2014-06-04 ENCOUNTER — Other Ambulatory Visit: Payer: Self-pay | Admitting: Family Medicine

## 2014-06-05 ENCOUNTER — Encounter: Payer: Self-pay | Admitting: Medical

## 2014-06-05 ENCOUNTER — Ambulatory Visit (INDEPENDENT_AMBULATORY_CARE_PROVIDER_SITE_OTHER): Payer: BC Managed Care – PPO | Admitting: Medical

## 2014-06-05 VITALS — BP 144/94 | HR 98 | Temp 98.6°F | Wt 158.1 lb

## 2014-06-05 DIAGNOSIS — M25519 Pain in unspecified shoulder: Secondary | ICD-10-CM

## 2014-06-05 DIAGNOSIS — G4700 Insomnia, unspecified: Secondary | ICD-10-CM

## 2014-06-05 DIAGNOSIS — M25512 Pain in left shoulder: Secondary | ICD-10-CM

## 2014-06-05 DIAGNOSIS — M25569 Pain in unspecified knee: Secondary | ICD-10-CM

## 2014-06-05 DIAGNOSIS — M25562 Pain in left knee: Secondary | ICD-10-CM

## 2014-06-05 HISTORY — DX: Pain in unspecified shoulder: M25.519

## 2014-06-05 HISTORY — DX: Pain in left knee: M25.562

## 2014-06-05 HISTORY — DX: Insomnia, unspecified: G47.00

## 2014-06-05 MED ORDER — HYDROXYZINE PAMOATE 25 MG PO CAPS: 25.0000 mg | ORAL_CAPSULE | Freq: Every evening | ORAL | Status: DC | PRN

## 2014-06-05 MED ORDER — DICLOFENAC SODIUM 75 MG PO TBEC: 75.0000 mg | DELAYED_RELEASE_TABLET | Freq: Two times a day (BID) | ORAL | Status: DC

## 2014-06-05 NOTE — Assessment & Plan Note (Signed)
Will get xray of knee and follow if pain persists. If pain persists consider PT, ortho or further imaging.  Xray of knee and diclofenac.

## 2014-06-05 NOTE — Progress Notes (Signed)
Pre-visit discussion using our clinic review tool. No additional management support is needed unless otherwise documented below in the visit note.  

## 2014-06-05 NOTE — Assessment & Plan Note (Addendum)
Moslty in posterior aspect. Will get xray and advise ROM exercises. If pain persists consider PT, ortho or further imaging.  Diclofenac rx.

## 2014-06-05 NOTE — Progress Notes (Signed)
   Subjective:    Patient ID: Angela Wall, female    DOB: [birth year], 48 y.o.   MRN: [id number]  HPI  Pt in left arm and posterior shoulder pain been hurting for about week and half. Pain is work with movment or laying on her arm. No fall or trauma. No work outs. No heavy lifting. Her lt shoulder hurts a little. No chest pain. No back pain. No jaw pain, no sweating presently. Pt has taken ibuprofen for pain and helps some. Presently level 5/10 pain.   Lt knee pain that started this weekend and it is swollen. No injury or fall. Noticed when she walking it would hurt. No other arthralgias.   Pt also reports recent stress. Uncle passed away July 28th. Grandmother passed away April 18th. Pt works in [employer]. Recenlty very busy.  3-4 months of difficulty sleeping with passing of relatives. Reports not depression or anxiety.    Review of Systems  Constitutional: Negative for fever, chills and fatigue.  HENT: Negative.   Respiratory: Negative for cough, chest tightness and wheezing.   Cardiovascular: Negative for chest pain and palpitations.  Gastrointestinal: Negative for nausea, vomiting, abdominal pain, diarrhea, constipation, blood in stool and rectal pain.  Genitourinary: Negative.   Musculoskeletal: Positive for myalgias.       Lt shoulder and lt knee pain. Lt upper arm pain as well.  Lt knee pain. No popliteal pain.  Skin: Negative for color change and rash.       No rash.  Neurological: Negative for dizziness, syncope, speech difficulty, weakness, light-headedness, numbness and headaches.  Hematological: Negative for adenopathy. Does not bruise/bleed easily.       Objective:   Physical Exam  Constitutional: She is oriented to person, place, and time. She appears well-developed.  HENT:  Head: Normocephalic and atraumatic.  Neck: Normal range of motion. Neck supple. No thyromegaly present.  Cardiovascular: Normal rate, regular rhythm and  normal heart sounds.   Pulmonary/Chest: Effort normal and breath sounds normal. No respiratory distress. She has no wheezes. She has no rales. She exhibits no tenderness.  Musculoskeletal: Normal range of motion. She exhibits no edema.  Lt shoulder- FROM, but pain posterior aspect on palpation and rom. No crepitus.   Neck- no midspinal tenderness. Lt trapezius tenderness.   Lt knee- No instability. No crepitus. Medial aspect tender to palpation. Tender to palpation over tibial plateau medial aspect. Negative homans signs. Calfs are symmetric to palpation. No pedal edeam  Lt upper ext- mild tender to palpation over tricep area.  Neurological: She is alert and oriented to person, place, and time.  Psychiatric: She has a normal mood and affect. Her behavior is normal. Judgment and thought content normal.             Assessment & Plan:

## 2014-06-05 NOTE — Telephone Encounter (Signed)
Med filled.  

## 2014-06-05 NOTE — Assessment & Plan Note (Signed)
rx Vistaril to use q hs prn insomnia. Advise limited use. Follow if has any future depression or anxiety symptoms.

## 2014-06-05 NOTE — Patient Instructions (Addendum)
For your lt shoulder pain and tricep pain, I am prescribing diclofenac prescription. I will send that to your pharmacy. You can do shoulder rom exercises. Your pain is present on palpation and movement. (So appears to muscular) If at any point any chest pain notify us or be seen UC or ED. We will get xray of lt shoulder as well.  For your lt knee pain diclofenac should help as well. Also get lt knee xray. If pain changes to back of knee(poplital area notify us. As would need doppler in that event)  If either area pain persist consider PT, ortho referal or possible MRI evaluate for internal derangement.  For your insomnia. I am prescribing vistaril(hydroxyzine). Would recommend using it every 3rd night if needed but not to use every night.

## 2014-06-08 ENCOUNTER — Other Ambulatory Visit: Payer: Self-pay | Admitting: Medical

## 2014-06-08 ENCOUNTER — Ambulatory Visit (INDEPENDENT_AMBULATORY_CARE_PROVIDER_SITE_OTHER)
Admission: RE | Admit: 2014-06-08 | Discharge: 2014-06-08 | Disposition: A | Discharge status: Home / Self Care | Payer: BC Managed Care – PPO | Source: Ambulatory Visit | Attending: Medical | Admitting: Medical

## 2014-06-08 DIAGNOSIS — M25562 Pain in left knee: Secondary | ICD-10-CM

## 2014-06-08 DIAGNOSIS — M25519 Pain in unspecified shoulder: Secondary | ICD-10-CM

## 2014-06-08 DIAGNOSIS — M25569 Pain in unspecified knee: Secondary | ICD-10-CM

## 2014-06-08 DIAGNOSIS — M25512 Pain in left shoulder: Secondary | ICD-10-CM

## 2014-06-09 ENCOUNTER — Telehealth: Payer: Self-pay | Admitting: Medical

## 2014-06-09 DIAGNOSIS — M25562 Pain in left knee: Secondary | ICD-10-CM

## 2014-06-15 NOTE — Telephone Encounter (Signed)
error 

## 2014-07-01 ENCOUNTER — Other Ambulatory Visit: Payer: Self-pay | Admitting: Family Medicine

## 2014-07-03 NOTE — Telephone Encounter (Signed)
Med filled x 1 month. Pt due for physical exam.  Called patient to scheduled visit.  Pt stated that she would have to call back with date and time.

## 2014-07-30 ENCOUNTER — Other Ambulatory Visit: Payer: Self-pay | Admitting: General Practice

## 2014-07-30 MED ORDER — LOSARTAN POTASSIUM 100 MG PO TABS: ORAL_TABLET | ORAL | Status: DC

## 2014-07-30 NOTE — Telephone Encounter (Signed)
Med filled and letter mailed to pt to schedule appt.  

## 2014-08-27 ENCOUNTER — Other Ambulatory Visit: Payer: Self-pay | Admitting: Family Medicine

## 2014-08-28 ENCOUNTER — Other Ambulatory Visit: Payer: Self-pay | Admitting: Internal Medicine

## 2014-09-12 ENCOUNTER — Ambulatory Visit (INDEPENDENT_AMBULATORY_CARE_PROVIDER_SITE_OTHER): Payer: BC Managed Care – PPO | Admitting: Family Medicine

## 2014-09-12 ENCOUNTER — Encounter: Payer: Self-pay | Admitting: Family Medicine

## 2014-09-12 VITALS — BP 132/88 | HR 92 | Temp 98.2°F | Resp 16 | Wt 154.5 lb

## 2014-09-12 DIAGNOSIS — G4700 Insomnia, unspecified: Secondary | ICD-10-CM

## 2014-09-12 DIAGNOSIS — M25512 Pain in left shoulder: Secondary | ICD-10-CM

## 2014-09-12 DIAGNOSIS — I10 Essential (primary) hypertension: Secondary | ICD-10-CM

## 2014-09-12 LAB — BASIC METABOLIC PANEL
BUN: 12 mg/dL (ref 6–23)
CALCIUM: 9.1 mg/dL (ref 8.4–10.5)
CO2: 26 mEq/L (ref 19–32)
Chloride: 108 mEq/L (ref 96–112)
Creatinine, Ser: 0.8 mg/dL (ref 0.4–1.2)
GFR: 97.7 mL/min (ref >60.00)
GLUCOSE: 104 mg/dL — AB (ref 70–99)
Potassium: 4.4 mEq/L (ref 3.5–5.1)
Sodium: 140 mEq/L (ref 135–145)

## 2014-09-12 MED ORDER — LOSARTAN POTASSIUM 100 MG PO TABS: 100.0000 mg | ORAL_TABLET | Freq: Every day | ORAL | Status: DC

## 2014-09-12 MED ORDER — OMEPRAZOLE MAGNESIUM 20 MG PO TBEC: 20.0000 mg | DELAYED_RELEASE_TABLET | Freq: Every day | ORAL | Status: DC

## 2014-09-12 MED ORDER — FUROSEMIDE 20 MG PO TABS: ORAL_TABLET | ORAL | Status: DC

## 2014-09-12 MED ORDER — MELOXICAM 15 MG PO TABS: 15.0000 mg | ORAL_TABLET | Freq: Every day | ORAL | Status: DC

## 2014-09-12 MED ORDER — TRAZODONE HCL 50 MG PO TABS: 25.0000 mg | ORAL_TABLET | Freq: Every evening | ORAL | Status: DC | PRN

## 2014-09-12 NOTE — Progress Notes (Signed)
Pre visit review using our clinic review tool, if applicable. No additional management support is needed unless otherwise documented below in the visit note. 

## 2014-09-12 NOTE — Progress Notes (Signed)
   Subjective:    Patient ID: Nyoka Lintamatha S Drier, female    DOB: [birth year], 48 y.o.   MRN: [id number]  HPI HTN- chronic problem, adequate control on Losartan, Lasix.  Denies CP, SOB, HAs, visual changes, edema.  Insomnia- pt's grandmother passed in April and disabled Uncle passed in July.  Pt has been trying to handle estates of both.  Pt was given hydroxyzine w/o relief- has AM hangover.  L neck and shoulder pain- pain is radiating from shoulder up into neck.  Unable to sleep on that side.  Had xrays that showed minor AC arthritic change.     Review of Systems For ROS see HPI     Objective:   Physical Exam  Constitutional: She is oriented to person, place, and time. She appears well-developed and well-nourished. No distress.  HENT:  Head: Normocephalic and atraumatic.  Eyes: Conjunctivae and EOM are normal. Pupils are equal, round, and reactive to light.  Neck: Normal range of motion. Neck supple. No thyromegaly present.  L trap spasm  Cardiovascular: Normal rate, regular rhythm, normal heart sounds and intact distal pulses.   No murmur heard. Pulmonary/Chest: Effort normal and breath sounds normal. No respiratory distress.  Abdominal: Soft. She exhibits no distension. There is no tenderness.  Musculoskeletal: She exhibits tenderness (TTP over L trap spasm). She exhibits no edema.  Lymphadenopathy:    She has no cervical adenopathy.  Neurological: She is alert and oriented to person, place, and time.  Skin: Skin is warm and dry.  Psychiatric: She has a normal mood and affect. Her behavior is normal.  Vitals reviewed.         Assessment & Plan:

## 2014-09-12 NOTE — Patient Instructions (Signed)
Schedule your complete physical for 6 months We'll notify you of your lab results and make any changes if needed Start the trazodone nightly for sleep- start w/ 1/2 tab and increase to whole tab if needed Start the daily Mobic- take w/ food We'll call you with your ortho appt for the shoulder Call with any questions or concerns Happy Holidays!!!

## 2014-09-16 NOTE — Assessment & Plan Note (Signed)
New to provider, ongoing for pt due to recent deaths in family.  Start Trazodone.  Reviewed sleep hygiene.  Will follow.

## 2014-09-16 NOTE — Assessment & Plan Note (Signed)
New to provider, ongoing for pt.  Start daily NSAID.  Refer to ortho.  Pt expressed understanding and is in agreement w/ plan.

## 2014-09-16 NOTE — Assessment & Plan Note (Signed)
Chronic problem.  Adequate control.  Asymptomatic.  Check labs.  No anticipated med changes 

## 2014-11-10 ENCOUNTER — Other Ambulatory Visit: Payer: Self-pay | Admitting: Family Medicine

## 2014-11-12 NOTE — Telephone Encounter (Signed)
Med filled.  

## 2015-02-13 ENCOUNTER — Other Ambulatory Visit: Payer: Self-pay | Admitting: Family Medicine

## 2015-02-13 NOTE — Telephone Encounter (Signed)
Med filled.  

## 2015-03-16 ENCOUNTER — Other Ambulatory Visit: Payer: Self-pay | Admitting: Family Medicine

## 2015-03-18 NOTE — Telephone Encounter (Signed)
Med filled.  

## 2015-04-09 ENCOUNTER — Telehealth: Payer: Self-pay | Admitting: Family Medicine

## 2015-04-09 ENCOUNTER — Ambulatory Visit: Payer: BC Managed Care – PPO | Admitting: Internal Medicine

## 2015-04-09 NOTE — Telephone Encounter (Signed)
Is ok.

## 2015-04-09 NOTE — Telephone Encounter (Signed)
Pt canceled 4pm appointment due to work conflict.

## 2015-04-11 ENCOUNTER — Telehealth: Payer: Self-pay | Admitting: Family Medicine

## 2015-04-11 NOTE — Telephone Encounter (Signed)
No, thx 

## 2015-04-11 NOTE — Telephone Encounter (Signed)
Pt was no show for appt on 04/09/15. Called same day to cancel- charge?

## 2015-05-09 ENCOUNTER — Encounter: Payer: Self-pay | Admitting: Gastroenterology

## 2015-05-23 ENCOUNTER — Ambulatory Visit (INDEPENDENT_AMBULATORY_CARE_PROVIDER_SITE_OTHER): Payer: BC Managed Care – PPO | Admitting: Family Medicine

## 2015-05-23 ENCOUNTER — Encounter: Payer: Self-pay | Admitting: Family Medicine

## 2015-05-23 VITALS — BP 136/90 | HR 101 | Temp 98.1°F | Resp 16 | Ht 60.0 in | Wt 161.6 lb

## 2015-05-23 DIAGNOSIS — R600 Localized edema: Secondary | ICD-10-CM | POA: Insufficient documentation

## 2015-05-23 DIAGNOSIS — R0789 Other chest pain: Secondary | ICD-10-CM

## 2015-05-23 DIAGNOSIS — M62838 Other muscle spasm: Secondary | ICD-10-CM | POA: Diagnosis not present

## 2015-05-23 DIAGNOSIS — N644 Mastodynia: Secondary | ICD-10-CM | POA: Diagnosis not present

## 2015-05-23 DIAGNOSIS — M6248 Contracture of muscle, other site: Secondary | ICD-10-CM

## 2015-05-23 DIAGNOSIS — R609 Edema, unspecified: Secondary | ICD-10-CM | POA: Diagnosis not present

## 2015-05-23 HISTORY — DX: Edema, unspecified: R60.9

## 2015-05-23 HISTORY — DX: Mastodynia: N64.4

## 2015-05-23 MED ORDER — FUROSEMIDE 20 MG PO TABS: ORAL_TABLET | ORAL | Status: DC

## 2015-05-23 MED ORDER — CYCLOBENZAPRINE HCL 10 MG PO TABS: 10.0000 mg | ORAL_TABLET | Freq: Three times a day (TID) | ORAL | Status: DC | PRN

## 2015-05-23 NOTE — Progress Notes (Signed)
Patient ID: Angela Wall, female    DOB: 1966-06-24  Age: 49 y.o. MRN: [id number]    Subjective:  Subjective HPI Angela Wall presents for L breast pain. She has a hx of L shoulder pain and has been seen by ortho is past but it has gotten worse.  Pain is shooting pain that comes and goes.  No known injury.  No sob, no cp, no palpitations.  + knot L shoulder Pt also c/o LE edema esp since it has been hot.  No calf pain, no errythema   Review of Systems  Constitutional: Negative for diaphoresis, appetite change, fatigue and unexpected weight change.  Eyes: Negative for pain, redness and visual disturbance.  Respiratory: Negative for cough, chest tightness, shortness of breath and wheezing.   Cardiovascular: Negative for chest pain, palpitations and leg swelling.  Endocrine: Negative for cold intolerance, heat intolerance, polydipsia, polyphagia and polyuria.  Genitourinary: Negative for dysuria, frequency and difficulty urinating.  Neurological: Negative for dizziness, light-headedness, numbness and headaches.  Psychiatric/Behavioral: Negative for dysphoric mood. The patient is not nervous/anxious.     History Past Medical History  Diagnosis Date  . Hypertension   . GERD (gastroesophageal reflux disease)     She has past surgical history that includes BTL post-bilateral ectopics; Endometrial ablation; and Tubal ligation.   Her family history includes Cancer in her mother; Hypertension in her other; Kidney disease in her maternal uncle.She reports that she has never smoked. She does not have any smokeless tobacco history on file. She reports that she does not drink alcohol or use illicit drugs.  Current Outpatient Prescriptions on File Prior to Visit  Medication Sig Dispense Refill  . losartan (COZAAR) 100 MG tablet TAKE 1 TABLET (100 MG TOTAL) BY MOUTH DAILY. 90 tablet 0  . meloxicam (MOBIC) 15 MG tablet TAKE 1 TABLET (15 MG TOTAL) BY MOUTH DAILY. 30 tablet 1  . PRILOSEC  OTC 20 MG tablet TAKE ONE CAPSULE BY MOUTH EVERY DAY 90 tablet 0   No current facility-administered medications on file prior to visit.     Objective:  Objective Physical Exam  Constitutional: She is oriented to person, place, and time. She appears well-developed and well-nourished.  HENT:  Head: Normocephalic and atraumatic.  Eyes: Conjunctivae and EOM are normal.  Neck: Normal range of motion. Neck supple. No JVD present. Carotid bruit is not present. No thyromegaly present.  Cardiovascular: Normal rate, regular rhythm and normal heart sounds.   No murmur heard. Pulmonary/Chest: Effort normal and breath sounds normal. No respiratory distress. She has no wheezes. She has no rales. She exhibits no tenderness. Right breast exhibits no inverted nipple, no mass, no nipple discharge, no skin change and no tenderness. Left breast exhibits tenderness. Left breast exhibits no inverted nipple, no mass, no nipple discharge and no skin change. Breasts are symmetrical.    Musculoskeletal: She exhibits edema. She exhibits no tenderness.  + pitting edema B/L low ext / ankles -- L > R  Neurological: She is alert and oriented to person, place, and time.  Psychiatric: She has a normal mood and affect. Her behavior is normal.   BP 136/90 mmHg  Pulse 101  Temp(Src) 98.1 F (36.7 C) (Oral)  Resp 16  Ht 5' (1.524 m)  Wt 161 lb 9.6 oz (73.301 kg)  BMI 31.56 kg/m2  SpO2 99% Wt Readings from Last 3 Encounters:  05/23/15 161 lb 9.6 oz (73.301 kg)  09/12/14 154 lb 8 oz (70.081 kg)  06/05/14 158 lb  2 oz (71.725 kg)     Lab Results  Component Value Date   WBC 8.0 11/03/2013   HGB 12.8 11/03/2013   HCT 37.7 11/03/2013   PLT 252.0 11/03/2013   GLUCOSE 104* 09/12/2014   CHOL 156 11/03/2013   TRIG 78.0 11/03/2013   HDL 46.20 11/03/2013   LDLCALC 94 11/03/2013   ALT 13 11/03/2013   AST 15 11/03/2013   NA 140 09/12/2014   K 4.4 09/12/2014   CL 108 09/12/2014   CREATININE 0.8 09/12/2014   BUN  12 09/12/2014   CO2 26 09/12/2014   TSH 0.82 11/03/2013   HGBA1C 5.6 03/14/2012    Dg Knee 1-2 Views Left  06/08/2014   CLINICAL DATA:  Knee pain and swelling with walking. No known injury.  EXAM: LEFT KNEE - 1-2 VIEW  COMPARISON:  None.  FINDINGS: The mineralization and alignment are normal. There is no evidence of acute fracture or dislocation. The joint spaces are maintained. There is a possible small knee joint effusion, not definite.  IMPRESSION: No acute osseous findings.  Possible small joint effusion.   Electronically Signed   By: Roxy Horseman M.D.   On: 06/08/2014 15:29   Dg Shoulder Left  06/08/2014   CLINICAL DATA:  Left shoulder pain.  EXAM: LEFT SHOULDER - 2+ VIEW  COMPARISON:  None.  FINDINGS: No fracture. No bone lesion. Glenohumeral joint is normally spaced and aligned. Minor AC joint osteoarthritis. Underlying ribs are intact. Normal soft tissues.  IMPRESSION: Minor AC joint osteoarthritis.  No other abnormality.   Electronically Signed   By: Amie Portland M.D.   On: 06/08/2014 15:29     Assessment & Plan:  Plan I have discontinued Angela Wall's traZODone. I have also changed her furosemide. Additionally, I am having her start on cyclobenzaprine. Lastly, I am having her maintain her meloxicam, losartan, and PRILOSEC OTC.  Meds ordered this encounter  Medications  . furosemide (LASIX) 20 MG tablet    Sig: 2 po qd    Dispense:  180 tablet    Refill:  1  . cyclobenzaprine (FLEXERIL) 10 MG tablet    Sig: Take 1 tablet (10 mg total) by mouth 3 (three) times daily as needed for muscle spasms.    Dispense:  30 tablet    Refill:  0    Problem List Items Addressed This Visit    Trapezius muscle spasm    On Left--  Flexeril prn at night  mobic -- can increase to a whole one if needed F/u prn        Edema    Elevate legs Compression socks Lasix  rto 2 weeks --- check bmp Pt instructed to eat/ drink something with potassium in eat.      Relevant Medications    furosemide (LASIX) 20 MG tablet   Breast pain, left    diag mammogram ordered       Other Visit Diagnoses    Atypical chest pain    -  Primary    Relevant Orders    EKG 12-Lead (Completed)    Breast pain        Relevant Orders    MM Digital Diagnostic Bilat    Muscle spasm        Relevant Medications    cyclobenzaprine (FLEXERIL) 10 MG tablet       Follow-up: Return in about 2 weeks (around 06/06/2015), or if symptoms worsen or fail to improve.  Loreen Freud, DO

## 2015-05-23 NOTE — Assessment & Plan Note (Signed)
diag mammogram ordered

## 2015-05-23 NOTE — Progress Notes (Signed)
Pre visit review using our clinic review tool, if applicable. No additional management support is needed unless otherwise documented below in the visit note. 

## 2015-05-23 NOTE — Assessment & Plan Note (Signed)
On Left--  Flexeril prn at night  mobic -- can increase to a whole one if needed F/u prn

## 2015-05-23 NOTE — Assessment & Plan Note (Signed)
Elevate legs Compression socks Lasix  rto 2 weeks --- check bmp Pt instructed to eat/ drink something with potassium in eat.

## 2015-05-23 NOTE — Patient Instructions (Signed)

## 2015-05-31 ENCOUNTER — Telehealth: Payer: Self-pay | Admitting: Family Medicine

## 2015-05-31 ENCOUNTER — Other Ambulatory Visit: Payer: Self-pay | Admitting: Family Medicine

## 2015-05-31 DIAGNOSIS — N644 Mastodynia: Secondary | ICD-10-CM

## 2015-05-31 NOTE — Telephone Encounter (Signed)
Error/gd °

## 2015-06-12 ENCOUNTER — Other Ambulatory Visit: Payer: Self-pay | Admitting: Family Medicine

## 2015-06-13 ENCOUNTER — Other Ambulatory Visit: Payer: Self-pay | Admitting: Family Medicine

## 2015-06-13 NOTE — Telephone Encounter (Signed)
Medication filled to pharmacy as requested.   

## 2015-06-13 NOTE — Telephone Encounter (Signed)
Last OV 09/12/14 Pt informed to call office to schedule a CPE.   Medication filled to pharmacy as requested.

## 2015-06-17 ENCOUNTER — Ambulatory Visit
Admission: RE | Admit: 2015-06-17 | Discharge: 2015-06-17 | Disposition: A | Discharge status: Home / Self Care | Payer: BC Managed Care – PPO | Source: Ambulatory Visit | Attending: Family Medicine | Admitting: Family Medicine

## 2015-06-17 DIAGNOSIS — N644 Mastodynia: Secondary | ICD-10-CM

## 2015-06-20 ENCOUNTER — Other Ambulatory Visit: Payer: Self-pay | Admitting: Family Medicine

## 2015-06-20 NOTE — Telephone Encounter (Signed)
BP appt needed for any refills.

## 2015-07-22 ENCOUNTER — Other Ambulatory Visit: Payer: Self-pay | Admitting: Family Medicine

## 2015-07-22 NOTE — Telephone Encounter (Signed)
Dr Beverely Low this is your patient but Dr Laury Axon Rx'd the Lasix.  Please advise if this is agreeable to continue.

## 2015-07-22 NOTE — Telephone Encounter (Signed)
Medication filled to pharmacy as requested.   

## 2015-07-22 NOTE — Telephone Encounter (Signed)
Ok for refill but pt is overdue for f/u appt

## 2015-08-21 ENCOUNTER — Encounter: Payer: Self-pay | Admitting: Physician Assistant

## 2015-08-21 ENCOUNTER — Ambulatory Visit (INDEPENDENT_AMBULATORY_CARE_PROVIDER_SITE_OTHER): Payer: BC Managed Care – PPO | Admitting: Physician Assistant

## 2015-08-21 VITALS — BP 152/92 | HR 98 | Temp 98.1°F | Resp 16 | Ht 60.0 in | Wt 159.5 lb

## 2015-08-21 DIAGNOSIS — M542 Cervicalgia: Secondary | ICD-10-CM | POA: Diagnosis not present

## 2015-08-21 DIAGNOSIS — M5442 Lumbago with sciatica, left side: Secondary | ICD-10-CM | POA: Diagnosis not present

## 2015-08-21 DIAGNOSIS — M62838 Other muscle spasm: Secondary | ICD-10-CM

## 2015-08-21 MED ORDER — METHYLPREDNISOLONE 4 MG PO TBPK: ORAL_TABLET | ORAL | Status: DC

## 2015-08-21 MED ORDER — CYCLOBENZAPRINE HCL 10 MG PO TABS: 10.0000 mg | ORAL_TABLET | Freq: Three times a day (TID) | ORAL | Status: DC | PRN
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
## 2015-08-21 NOTE — Patient Instructions (Signed)
Please take it easy over the next few days. Usually day 2 and 3 are the worst before symptoms get better. No heavy lifting or overexertion. Stay well hydrated. Take the Medrol pack as directed. Use Extra Strength tylenol for breakthrough pain. Apply topical Aspercreme or Salon Pas to the area.  Make sure to continue your chronic medication as directed. BP will improve as pain improves.

## 2015-08-25 ENCOUNTER — Other Ambulatory Visit: Payer: Self-pay | Admitting: Family Medicine

## 2015-08-26 ENCOUNTER — Other Ambulatory Visit: Payer: Self-pay | Admitting: Physician Assistant

## 2015-08-26 ENCOUNTER — Ambulatory Visit (HOSPITAL_BASED_OUTPATIENT_CLINIC_OR_DEPARTMENT_OTHER)
Admission: RE | Admit: 2015-08-26 | Discharge: 2015-08-26 | Disposition: A | Discharge status: Home / Self Care | Payer: BC Managed Care – PPO | Source: Ambulatory Visit | Attending: Physician Assistant | Admitting: Physician Assistant

## 2015-08-26 ENCOUNTER — Telehealth: Payer: Self-pay | Admitting: Family Medicine

## 2015-08-26 DIAGNOSIS — M542 Cervicalgia: Secondary | ICD-10-CM | POA: Insufficient documentation

## 2015-08-26 DIAGNOSIS — M47892 Other spondylosis, cervical region: Secondary | ICD-10-CM | POA: Diagnosis not present

## 2015-08-26 DIAGNOSIS — M545 Low back pain: Secondary | ICD-10-CM | POA: Diagnosis not present

## 2015-08-26 DIAGNOSIS — M5440 Lumbago with sciatica, unspecified side: Secondary | ICD-10-CM

## 2015-08-26 DIAGNOSIS — M5090 Cervical disc disorder, unspecified, unspecified cervical region: Secondary | ICD-10-CM
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
## 2015-08-26 NOTE — Telephone Encounter (Signed)
Please call to assess if pain is only of cervical spine or other areas. [facility] her cervical spine (ok to place order). We will call with results and further instructions.

## 2015-08-26 NOTE — Telephone Encounter (Signed)
Medication filled to pharmacy as requested.   

## 2015-08-26 NOTE — Telephone Encounter (Signed)
Patient informed, understood & agreed; Xray orders placed for Cervical [past Hx 2011 Imaging-disc disease w/protrusions & spondylosis] & [facility]

## 2015-08-27 ENCOUNTER — Telehealth: Payer: Self-pay

## 2015-08-27 NOTE — Telephone Encounter (Signed)
Patient returning your call regarding yesterdays conversation has additional questions

## 2015-08-27 NOTE — Telephone Encounter (Signed)
error 

## 2015-08-27 NOTE — Telephone Encounter (Signed)
LMOM with contact name and number for return call RE: further advice needed/SLS

## 2015-08-27 NOTE — Telephone Encounter (Signed)
Pt returned called, pt states can returned call at [phone]. Please advice

## 2015-08-27 NOTE — Telephone Encounter (Signed)
Patient calling back checking on the status of below message  °

## 2015-08-28 NOTE — Telephone Encounter (Signed)
Spoke with patient about concerns, copy of results from 2011 & recently done have been printed for pt at her request, along with work note requested and approved & signed by provider/SLS

## 2015-09-16 ENCOUNTER — Encounter: Payer: Self-pay | Admitting: Family

## 2015-09-16 ENCOUNTER — Telehealth: Payer: Self-pay | Admitting: Family Medicine

## 2015-09-16 ENCOUNTER — Ambulatory Visit (INDEPENDENT_AMBULATORY_CARE_PROVIDER_SITE_OTHER): Payer: BC Managed Care – PPO | Admitting: Family

## 2015-09-16 VITALS — BP 169/102 | HR 111 | Temp 100.0°F | Resp 18 | Ht 60.0 in | Wt 159.8 lb

## 2015-09-16 DIAGNOSIS — R059 Cough, unspecified: Secondary | ICD-10-CM

## 2015-09-16 DIAGNOSIS — R05 Cough: Secondary | ICD-10-CM

## 2015-09-16 LAB — POCT INFLUENZA A/B: Influenza A+B Virus Ag-Direct(Rapid): NEGATIVE

## 2015-09-16 NOTE — Telephone Encounter (Signed)
Application of short term diability.

## 2015-09-16 NOTE — Telephone Encounter (Signed)
Pt brought paperwork to be filled out.

## 2015-09-16 NOTE — Patient Instructions (Signed)
You may use tylenol or motrin as needed for fever/pain. For cough- try delsym which is available over the counter. Drink plenty of fluids, I think you are mildly dehydrated. Call if symptoms worsen, if you develop fever >101, or if you are not improved in 3 days.

## 2015-09-16 NOTE — Progress Notes (Signed)
Subjective:    Patient ID: Angela Wall, female    DOB: 1966/08/15, 49 y.o.   MRN: [id number]  HPI  Ms. Angela Wall is a 49 yr old female who presents today with chief complaint of cough.  She reports cough is productive of yellow sputum. Cough began 2 days ago. Did not take temp today. Has not taken tylenol or motrin today.    She had MVI 08/20/15   Review of Systems  HENT: Positive for rhinorrhea and sore throat.        Denies ear pain      see HPI  Past Medical History  Diagnosis Date  . Hypertension   . GERD (gastroesophageal reflux disease)     Social History   Social History  . Marital Status: Single    Spouse Name: N/A  . Number of Children: N/A  . Years of Education: N/A   Occupational History  . Not on file.   Social History Main Topics  . Smoking status: Never Smoker   . Smokeless tobacco: Not on file  . Alcohol Use: No  . Drug Use: No  . Sexual Activity: Yes    Birth Control/ Protection: Surgical   Other Topics Concern  . Not on file   Social History Narrative    Past Surgical History  Procedure Laterality Date  . Btl post-bilateral ectopics    . Endometrial ablation      ? Novasure  . Tubal ligation      Family History  Problem Relation Age of Onset  . Cancer Mother     Breast cancer <50  . Kidney disease Maternal Uncle   . Hypertension Other     Allergies  Allergen Reactions  . Oxycodone-Acetaminophen     REACTION: Itching  . Propoxyphene N-Acetaminophen     REACTION: Itching    Current Outpatient Prescriptions on File Prior to Visit  Medication Sig Dispense Refill  . cyclobenzaprine (FLEXERIL) 10 MG tablet Take 1 tablet (10 mg total) by mouth 3 (three) times daily as needed for muscle spasms. 30 tablet 0  . furosemide (LASIX) 20 MG tablet TAKE 2 TABLETS BY MOUTH EVERY DAY (Patient taking differently: TAKE 1-2 TABLETS BY MOUTH EVERY DAY AS NEEDED) 180 tablet 0  . losartan (COZAAR) 100 MG tablet TAKE 1 TABLET BY MOUTH DAILY  90 tablet 0  . meloxicam (MOBIC) 15 MG tablet TAKE 1 TABLET (15 MG TOTAL) BY MOUTH DAILY. (Patient taking differently: TAKE 1 TABLET (15 MG TOTAL) BY MOUTH DAILY AS NEEDED) 30 tablet 1  . PRILOSEC OTC 20 MG tablet TAKE ONE CAPSULE BY MOUTH EVERY DAY 84 tablet 0   No current facility-administered medications on file prior to visit.    BP 169/102 mmHg  Pulse 111  Temp(Src) 100 F (37.8 C) (Oral)  Resp 18  Ht 5' (1.524 m)  Wt 159 lb 12.8 oz (72.485 kg)  BMI 31.21 kg/m2  SpO2 100%    Objective:   Physical Exam  Constitutional: She appears well-developed and well-nourished.  HENT:  Head: Normocephalic and atraumatic.  Right Ear: Tympanic membrane and ear canal normal.  Left Ear: Tympanic membrane and ear canal normal.  Mouth/Throat: No oropharyngeal exudate, posterior oropharyngeal edema or posterior oropharyngeal erythema.  Cardiovascular: Regular rhythm and normal heart sounds.   No murmur heard. Mild tachycardia  Pulmonary/Chest: Effort normal and breath sounds normal. No respiratory distress. She has no wheezes.  Lymphadenopathy:    She has cervical adenopathy.  Psychiatric: She has a normal  mood and affect. Her behavior is normal. Judgment and thought content normal.          Assessment & Plan:  Viral URI-  Advised pt as follows:  You may use tylenol or motrin as needed for fever/pain. For cough- try delsym which is available over the counter. Drink plenty of fluids, I think you are mildly dehydrated. Call if symptoms worsen, if you develop fever >101, or if you are not improved in 3 days.

## 2015-09-16 NOTE — Progress Notes (Signed)
Pre visit review using our clinic review tool, if applicable. No additional management support is needed unless otherwise documented below in the visit note. 

## 2015-09-17 NOTE — Telephone Encounter (Signed)
Forms filled out as much as possible and forwarded to [employer]. JG//CMA

## 2015-09-18 NOTE — Telephone Encounter (Signed)
Completed form faxed to [employer] successfully at [phone]. Originals mailed to pt, copy sent for scanning. JG//CMA

## 2015-09-18 NOTE — Telephone Encounter (Signed)
Called pt to clarify her RTW date. She stated she returned to work full-time on 09/02/2015. JG//CMA

## 2015-09-23 ENCOUNTER — Telehealth: Payer: Self-pay | Admitting: Family Medicine

## 2015-09-23 ENCOUNTER — Other Ambulatory Visit: Payer: Self-pay | Admitting: Family Medicine

## 2015-09-23 NOTE — Telephone Encounter (Signed)
Pharmacy: [id number] - [place], [place] - [address]  Reason for call: Pt still having most of the same sx but no fever. Sore throat, cough, yellow mucus. Saw Melissa 09/16/15. Please advise.

## 2015-09-23 NOTE — Telephone Encounter (Signed)
Needs follow up if symptoms are not improved.

## 2015-09-23 NOTE — Telephone Encounter (Signed)
Medication filled to pharmacy as requested.   

## 2015-09-24 ENCOUNTER — Telehealth: Payer: Self-pay | Admitting: Family Medicine

## 2015-09-24 ENCOUNTER — Ambulatory Visit (INDEPENDENT_AMBULATORY_CARE_PROVIDER_SITE_OTHER): Payer: BC Managed Care – PPO | Admitting: Family Medicine

## 2015-09-24 ENCOUNTER — Encounter: Payer: Self-pay | Admitting: Family Medicine

## 2015-09-24 VITALS — BP 145/70 | HR 93 | Temp 98.9°F | Wt 158.0 lb

## 2015-09-24 DIAGNOSIS — J0100 Acute maxillary sinusitis, unspecified: Secondary | ICD-10-CM | POA: Insufficient documentation

## 2015-09-24 MED ORDER — AMOXICILLIN 875 MG PO TABS: 875.0000 mg | ORAL_TABLET | Freq: Two times a day (BID) | ORAL | Status: DC

## 2015-09-24 MED ORDER — PROMETHAZINE-DM 6.25-15 MG/5ML PO SYRP: 5.0000 mL | ORAL_SOLUTION | Freq: Four times a day (QID) | ORAL | Status: DC | PRN

## 2015-09-24 NOTE — Telephone Encounter (Signed)
Notified pt and scheduled appt today with PCP at 2:15pm.

## 2015-09-24 NOTE — Telephone Encounter (Signed)
Caller name: Self  Can be reached: [phone]  Pharmacy:  [id number], [place] - [address] [phone] (Phone) [phone] (Fax)         Reason for call: Patient saw Dr. Karie Schwalbe today and was given amoxicillin (AMOXIL) 875 MG tablet [[id number]][[id number]] requesting a prescription for 2 Diflucan pills to prevent yeast infection

## 2015-09-24 NOTE — Progress Notes (Signed)
Pre visit review using our clinic review tool, if applicable. No additional management support is needed unless otherwise documented below in the visit note. 

## 2015-09-24 NOTE — Progress Notes (Signed)
   Subjective:    Patient ID: Angela Wall, female    DOB: 1966/05/04, 49 y.o.   MRN: [id number]  HPI Cough- pt was seen 11/18 for similar sxs and was told it was a viral illness.  Continues to have cough, congestion, difficulty breathing through nose.  No recent fevers.  + sick contacts.  No N/V.  + maxillary sinus pressure.  + HA.  Cough is productive of yellow sputum.   Review of Systems For ROS see HPI     Objective:   Physical Exam  Constitutional: She appears well-developed and well-nourished. No distress.  HENT:  Head: Normocephalic and atraumatic.  Right Ear: Tympanic membrane normal.  Left Ear: Tympanic membrane normal.  Nose: Mucosal edema and rhinorrhea present. Right sinus exhibits maxillary sinus tenderness. Right sinus exhibits no frontal sinus tenderness. Left sinus exhibits maxillary sinus tenderness. Left sinus exhibits no frontal sinus tenderness.  Mouth/Throat: Uvula is midline and mucous membranes are normal. Posterior oropharyngeal erythema present. No oropharyngeal exudate.  Eyes: Conjunctivae and EOM are normal. Pupils are equal, round, and reactive to light.  Neck: Normal range of motion. Neck supple.  Cardiovascular: Normal rate, regular rhythm and normal heart sounds.   Pulmonary/Chest: Effort normal and breath sounds normal. No respiratory distress. She has no wheezes.  Lymphadenopathy:    She has no cervical adenopathy.  Vitals reviewed.         Assessment & Plan:

## 2015-09-24 NOTE — Patient Instructions (Signed)
Please make an appt to follow up BP and cholesterol Start the Amoxicillin twice daily- take w/ food Drink plenty of fluids Mucinex DM for daytime cough Promethazine syrup for nights/weekends (will cause drowsiness) REST! Call with any questions or concerns If you want to join us at the new [place] office, any scheduled appointments will automatically transfer and we will see you at [address], [place], [place] [id number] (OPENING 10/29/15) Hang in there! Happy Holidays!!!

## 2015-09-25 ENCOUNTER — Other Ambulatory Visit: Payer: Self-pay | Admitting: Family Medicine

## 2015-09-25 MED ORDER — FLUCONAZOLE 150 MG PO TABS: ORAL_TABLET | ORAL | Status: DC

## 2015-09-25 NOTE — Assessment & Plan Note (Signed)
Pt's sxs and PE consistent w/ infxn.  Start abx.  Cough meds prn.  Reviewed supportive care and red flags that should prompt return.  Pt expressed understanding and is in agreement w/ plan.  

## 2015-09-25 NOTE — Telephone Encounter (Signed)
Medication filled to pharmacy as requested.   

## 2015-09-25 NOTE — Telephone Encounter (Signed)
Pt is requesting copy of the forms sent to her directly. She said she never received copy that we said was mailed. Please have faxed to her at [phone] & call her before faxing at her cell # [phone]. Pt said that she has made this request 2 or 3 times and still has not received it.

## 2015-09-25 NOTE — Telephone Encounter (Signed)
Please advise, pt was seen yesterday for sinus infection. She has not had any labs completed since 10/2013. BP med was filled on 06/20/15 #90 with 0 and documentation that BP follow up is needed with PCP. No upcoming appts

## 2015-09-25 NOTE — Telephone Encounter (Signed)
Ok for #30, no refills.  Pt needs appt (she was told to schedule yesterday when she left)

## 2015-09-25 NOTE — Telephone Encounter (Signed)
Med filled #30 with 0 only. No further refills without a BP follow up with PCP.

## 2015-09-26 NOTE — Telephone Encounter (Signed)
Forms were mailed the day before Thanksgiving, so I'm sure she hasn't received a copy. I haven'treceived any other requests from the pt besides the initial request. Will fax once scanned in chart.

## 2015-10-26 ENCOUNTER — Other Ambulatory Visit: Payer: Self-pay | Admitting: Family Medicine

## 2015-10-29 NOTE — Telephone Encounter (Signed)
Medication filled to pharmacy as requested.   

## 2015-11-06 ENCOUNTER — Ambulatory Visit (INDEPENDENT_AMBULATORY_CARE_PROVIDER_SITE_OTHER): Payer: BC Managed Care – PPO | Admitting: Family Medicine

## 2015-11-06 ENCOUNTER — Encounter: Payer: Self-pay | Admitting: Family Medicine

## 2015-11-06 ENCOUNTER — Ambulatory Visit (HOSPITAL_BASED_OUTPATIENT_CLINIC_OR_DEPARTMENT_OTHER)
Admission: RE | Admit: 2015-11-06 | Discharge: 2015-11-06 | Disposition: A | Discharge status: Home / Self Care | Payer: BC Managed Care – PPO | Source: Ambulatory Visit | Attending: Family Medicine | Admitting: Family Medicine

## 2015-11-06 ENCOUNTER — Other Ambulatory Visit: Payer: Self-pay | Admitting: Family Medicine

## 2015-11-06 VITALS — BP 150/98 | HR 88 | Temp 98.3°F | Ht 61.0 in | Wt 159.0 lb

## 2015-11-06 DIAGNOSIS — M7989 Other specified soft tissue disorders: Secondary | ICD-10-CM | POA: Insufficient documentation

## 2015-11-06 DIAGNOSIS — I10 Essential (primary) hypertension: Secondary | ICD-10-CM

## 2015-11-06 DIAGNOSIS — M25571 Pain in right ankle and joints of right foot: Secondary | ICD-10-CM

## 2015-11-06 HISTORY — DX: Pain in right ankle and joints of right foot: M25.571

## 2015-11-06 MED ORDER — MELOXICAM 15 MG PO TABS: ORAL_TABLET | ORAL | Status: DC

## 2015-11-06 MED ORDER — LOSARTAN POTASSIUM 100 MG PO TABS: 100.0000 mg | ORAL_TABLET | Freq: Every day | ORAL | Status: DC

## 2015-11-06 NOTE — Telephone Encounter (Signed)
Pt called requesting 90 day supply of furosemide and 90 day supply of losartan be sent in. (she has not picked up the 30 day supply of losartan from 10/29/15). She is trying to use the rest of the money on her flex card before it expires.  [id number] - [place], [place] - [address]

## 2015-11-06 NOTE — Patient Instructions (Signed)
Follow up in 1 month to recheck BP Stop downstairs and get your xrays START the Mobic once daily- take w/ food ICE!  ELEVATE!! Wear supportive, cushioned sneakers Call with any questions or concerns Hang in there!!!

## 2015-11-06 NOTE — Assessment & Plan Note (Signed)
New.  Pt's swelling is consistent w/ bursitis but due to TTP along 5th metatarsal is concerning for possible stress fracture.  Will get xray to assess.  Pt to start mobic, wear supportive cushioned shoes, and ice for relief of pain/swelling.  Reviewed supportive care and red flags that should prompt return.  Pt expressed understanding and is in agreement w/ plan.

## 2015-11-06 NOTE — Progress Notes (Signed)
   Subjective:    Patient ID: Angela Wall, female    DOB: 04-25-1966, 50 y.o.   MRN: [id number]  HPI R ankle pain- sxs started ~1 week ago.  No known injury.  Painful along lateral foot.  Painful to bear weight.  Keeping pt awake at night.  + swelling.  No change in footwear.  No change in activity level.  Taking lasix daily.  HTN- deteriorated.  Pt's BP is again elevated today.  Denies CP, SOB, HAs, visual changes.  She is in pain today.  Reports good compliance w/ Losartan and Lasix.   Review of Systems For ROS see HPI     Objective:   Physical Exam  Constitutional: She is oriented to person, place, and time. She appears well-developed and well-nourished. No distress.  HENT:  Head: Normocephalic and atraumatic.  Cardiovascular: Normal rate, regular rhythm, normal heart sounds and intact distal pulses.   Pulmonary/Chest: Effort normal and breath sounds normal. No respiratory distress. She has no wheezes. She has no rales.  Musculoskeletal: She exhibits edema (swelling anterior and posterior to R lateral malleolus) and tenderness (TTP over R lateral malleolus and along 5th metatarsal).  Neurological: She is alert and oriented to person, place, and time. No cranial nerve deficit. Coordination normal.  Skin: Skin is warm and dry. No erythema.  Psychiatric: She has a normal mood and affect. Her behavior is normal.  Vitals reviewed.         Assessment & Plan:

## 2015-11-06 NOTE — Assessment & Plan Note (Signed)
Deteriorated.  Pt's BP is again elevated.  She is in pain today, which could account for some of the elevation but I suspect she will need additional BP meds.  Pt to f/u in 3-4 weeks to reassess BP and adjust meds prn.  Pt expressed understanding and is in agreement w/ plan.

## 2015-11-06 NOTE — Progress Notes (Signed)
Pre visit review using our clinic review tool, if applicable. No additional management support is needed unless otherwise documented below in the visit note. 

## 2015-11-06 NOTE — Telephone Encounter (Signed)
Spoke with Ree KidaJack at the pharmacy and he will cancel the 30 day Rx of the Losartan.  Per VO from Dr. Beverely Lowabori ok to send 90 day supply for pt.

## 2015-12-02 ENCOUNTER — Telehealth: Payer: Self-pay | Admitting: Family Medicine

## 2015-12-02 NOTE — Telephone Encounter (Signed)
When rescheduling pt's appt w/PCP pt informed me that she has been experiencing some chest pain. Forwarded pt to [facility] for triaging.

## 2015-12-02 NOTE — Telephone Encounter (Signed)
Called patient advised if she continues to have chest pain or if chest pain worsened before appointment with Dr. Beverely Low tomorrow. she should go straight to UC or ED. Patient agreed.

## 2015-12-02 NOTE — Telephone Encounter (Signed)
Patient Name: Angela Wall DOB: Jul 23, 1966 Initial Comment caller states she has been having left side chest pain- at the top of her chest and also underneath her breast Nurse Assessment Nurse: Yetta Barre, RN, Miranda Date/Time (Eastern Time): 12/02/2015 1:07:54 PM Confirm and document reason for call. If symptomatic, describe symptoms. You must click the next button to save text entered. ---Caller states 2 nights ago she was woken up out of her sleep with a sharp pain in the upper part her chest on the left side. That pain has gone away now. She states she also has a place along her bra line on the left side today. Has the patient traveled out of the country within the last 30 days? ---Not Applicable Does the patient have any new or worsening symptoms? ---Yes Will a triage be completed? ---Yes Related visit to physician within the last 2 weeks? ---No Does the PT have any chronic conditions? (i.e. diabetes, asthma, etc.) ---Yes List chronic conditions. ---HTN Did the patient indicate they were pregnant? ---No Is this a behavioral health or substance abuse call? ---No Guidelines Guideline Title Affirmed Question Affirmed Notes Chest Pain [1] Chest pain(s) lasting a few seconds AND [2] persists > 3 days Final Disposition User See PCP When Office is Open (within 3 days) Yetta Barre, RN, Miranda Comments Appt scheduled for tomorrow at 1130am with Dr. Beverely Low. Referrals REFERRED TO PCP OFFICE Disagree/Comply: Comply

## 2015-12-03 ENCOUNTER — Ambulatory Visit: Payer: Self-pay | Admitting: Family Medicine

## 2015-12-03 NOTE — Telephone Encounter (Signed)
Patient canceled her appointment for 11:30am today and states she feels better and will keep her appointment for 12/09/15 and also patient states she has a work conflict.

## 2015-12-04 NOTE — Telephone Encounter (Signed)
No charge. 

## 2015-12-04 NOTE — Telephone Encounter (Signed)
Charge or no charge for no show 12/03/15?

## 2015-12-09 ENCOUNTER — Ambulatory Visit: Payer: BC Managed Care – PPO | Admitting: Physician Assistant

## 2015-12-09 ENCOUNTER — Encounter: Payer: Self-pay | Admitting: Family Medicine

## 2015-12-09 ENCOUNTER — Ambulatory Visit (INDEPENDENT_AMBULATORY_CARE_PROVIDER_SITE_OTHER): Payer: BC Managed Care – PPO | Admitting: Family Medicine

## 2015-12-09 VITALS — BP 150/98 | HR 99 | Temp 98.1°F | Ht 61.0 in | Wt 159.0 lb

## 2015-12-09 DIAGNOSIS — I10 Essential (primary) hypertension: Secondary | ICD-10-CM | POA: Diagnosis not present

## 2015-12-09 DIAGNOSIS — R238 Other skin changes: Secondary | ICD-10-CM

## 2015-12-09 DIAGNOSIS — R233 Spontaneous ecchymoses: Secondary | ICD-10-CM | POA: Insufficient documentation

## 2015-12-09 DIAGNOSIS — M25571 Pain in right ankle and joints of right foot: Secondary | ICD-10-CM

## 2015-12-09 HISTORY — DX: Other skin changes: R23.8

## 2015-12-09 HISTORY — DX: Spontaneous ecchymoses: R23.3

## 2015-12-09 LAB — CBC WITH DIFFERENTIAL/PLATELET
BASOS PCT: 0.7 % (ref 0.0–3.0)
Basophils Absolute: 0.1 10*3/uL (ref 0.0–0.1)
EOS ABS: 0.1 10*3/uL (ref 0.0–0.7)
Eosinophils Relative: 1.5 % (ref 0.0–5.0)
HCT: 38.9 % (ref 36.0–46.0)
Hemoglobin: 13 g/dL (ref 12.0–15.0)
Lymphocytes Relative: 31.3 % (ref 12.0–46.0)
Lymphs Abs: 2.4 10*3/uL (ref 0.7–4.0)
MCHC: 33.5 g/dL (ref 30.0–36.0)
MCV: 85.8 fl (ref 78.0–100.0)
MONO ABS: 0.3 10*3/uL (ref 0.1–1.0)
Monocytes Relative: 4.5 % (ref 3.0–12.0)
NEUTROS ABS: 4.8 10*3/uL (ref 1.4–7.7)
Neutrophils Relative %: 62 % (ref 43.0–77.0)
PLATELETS: 255 10*3/uL (ref 150.0–400.0)
RBC: 4.53 Mil/uL (ref 3.87–5.11)
RDW: 13.6 % (ref 11.5–15.5)
WBC: 7.7 10*3/uL (ref 4.0–10.5)

## 2015-12-09 LAB — BASIC METABOLIC PANEL
BUN: 10 mg/dL (ref 6–23)
CALCIUM: 9.4 mg/dL (ref 8.4–10.5)
CO2: 29 mEq/L (ref 19–32)
CREATININE: 0.73 mg/dL (ref 0.40–1.20)
Chloride: 104 mEq/L (ref 96–112)
GFR: 109.56 mL/min (ref >60.00)
Glucose, Bld: 110 mg/dL — ABNORMAL HIGH (ref 70–99)
Potassium: 3.9 mEq/L (ref 3.5–5.1)
Sodium: 138 mEq/L (ref 135–145)

## 2015-12-09 LAB — PROTIME-INR
INR: 1.1 ratio — AB (ref 0.8–1.0)
PROTHROMBIN TIME: 11.2 s (ref 9.6–13.1)

## 2015-12-09 MED ORDER — AMLODIPINE BESYLATE 5 MG PO TABS: 5.0000 mg | ORAL_TABLET | Freq: Every day | ORAL | Status: DC

## 2015-12-09 NOTE — Progress Notes (Signed)
Pre visit review using our clinic review tool, if applicable. No additional management support is needed unless otherwise documented below in the visit note. 

## 2015-12-09 NOTE — Progress Notes (Signed)
   Subjective:    Patient ID: Angela Wall, female    DOB: 1966/04/11, 50 y.o.   MRN: [id number]  HPI HTN- chronic problem, on Losartan and Lasix.  No CP, SOB, HAs, visual changes, edema.  Bruising- pt has nickel sized bruising on L upper arm x2, and L lower back.  No known injury.  Mildly sore to touch but otherwise not bothersome.  Not currently on on ASA.    R foot pain- lateral, TTP, worse at night.  + intermittent swelling.  No known injury.   Review of Systems For ROS see HPI     Objective:   Physical Exam  Constitutional: She is oriented to person, place, and time. She appears well-developed and well-nourished. No distress.  HENT:  Head: Normocephalic and atraumatic.  Eyes: Conjunctivae and EOM are normal. Pupils are equal, round, and reactive to light.  Neck: Normal range of motion. Neck supple. No thyromegaly present.  Cardiovascular: Normal rate, regular rhythm, normal heart sounds and intact distal pulses.   No murmur heard. Pulmonary/Chest: Effort normal and breath sounds normal. No respiratory distress.  Abdominal: Soft. She exhibits no distension. There is no tenderness.  Musculoskeletal: She exhibits tenderness (TTP over R lateral foot w/ some edema posterior to lateral malleolus). She exhibits no edema.  Lymphadenopathy:    She has no cervical adenopathy.  Neurological: She is alert and oriented to person, place, and time.  Skin: Skin is warm and dry.  Nickel sized bruising over L tricep just above elbow, L mid bicep, and L lower back  Psychiatric: She has a normal mood and affect. Her behavior is normal.  Vitals reviewed.         Assessment & Plan:

## 2015-12-09 NOTE — Assessment & Plan Note (Signed)
New.  Pt does not recall any injury and is not on ASA.  Does not take regular NSAIDs.  Bruises are small and likely from bumping into something, but given that she doesn't remember doing this, will get labs to r/o underlying abnormality.  Pt expressed understanding and is in agreement w/ plan.

## 2015-12-09 NOTE — Assessment & Plan Note (Signed)
Deteriorated.  Pt's BP is not at goal.  Based on this, will add Amlodipine to her losartan and lasix.  Reviewed lifestyle and dietary modifications that will improve BP.  Will follow closely.

## 2015-12-09 NOTE — Patient Instructions (Signed)
Follow up in 4-6 weeks to recheck BP We'll notify you of your lab results and make any changes if needed Add the Amlodipine daily Continue the Losartan and Lasix daily We'll call you with your sports med appt for the foot pain Call with any questions or concerns Hang in there! Happy Valentine's Day!!!

## 2015-12-09 NOTE — Assessment & Plan Note (Signed)
Unchanged from previous.  Despite negative xray- pt continues to have pain in R lateral foot that will wake her from sleep.  Refer to sports med for complete evaluation and tx.  Pt expressed understanding and is in agreement w/ plan.

## 2015-12-11 ENCOUNTER — Ambulatory Visit: Payer: BC Managed Care – PPO | Admitting: Family Medicine

## 2016-03-06 ENCOUNTER — Other Ambulatory Visit: Payer: Self-pay | Admitting: General Practice

## 2016-03-06 MED ORDER — LOSARTAN POTASSIUM 100 MG PO TABS: 100.0000 mg | ORAL_TABLET | Freq: Every day | ORAL | Status: DC

## 2016-06-08 ENCOUNTER — Other Ambulatory Visit: Payer: Self-pay | Admitting: Family Medicine

## 2016-06-09 ENCOUNTER — Encounter: Payer: Self-pay | Admitting: General Practice

## 2016-06-09 NOTE — Telephone Encounter (Signed)
Letter mailed to pt to inform  

## 2016-06-09 NOTE — Telephone Encounter (Signed)
Ok for #90 but no refills until appt

## 2016-06-09 NOTE — Telephone Encounter (Signed)
Please advise pt was seen in February and advised to come back in 4-6 weeks to recheck her BP, no follow ups were completed. Ok for 90 days of lasix?

## 2016-06-10 ENCOUNTER — Telehealth: Payer: Self-pay | Admitting: Family Medicine

## 2016-06-10 NOTE — Telephone Encounter (Signed)
Pt says that she is switching jobs and would like to know if she could have a 90 day supply on Furosemide Rx instead of 30 as well?     Pharmacy: [id number] - [place], [place] - [address]   ALSO, pt would like to know if she is due a follow up with provider?     Please advise for scheduling      Thanks.

## 2016-06-10 NOTE — Telephone Encounter (Signed)
Ok w/ me 

## 2016-06-10 NOTE — Telephone Encounter (Signed)
Pt says that she would like to switch providers. She says only because of the commute to current PCP office. She says that it is to far. She seen provider Selena Battenody a few times and would like to switch to him.

## 2016-06-10 NOTE — Telephone Encounter (Signed)
This medication was filled on 06/09/16 #180 with 0 (90 day supply).. Pt is overdue for a Blood Pressure follow up.

## 2016-06-11 NOTE — Telephone Encounter (Signed)
Fine with me but I would make note that I may be helping out at [place] office some. Just wanting to give her a heads up in case this affects her decision.

## 2016-06-12 NOTE — Telephone Encounter (Signed)
Called pt to schedule bp follow up. Pt says that she is currently in a different Dr. Alfonzo BeersAppt. She will call us back to schedule.

## 2016-08-31 ENCOUNTER — Other Ambulatory Visit: Payer: Self-pay | Admitting: Family Medicine

## 2016-09-16 ENCOUNTER — Ambulatory Visit (INDEPENDENT_AMBULATORY_CARE_PROVIDER_SITE_OTHER): Payer: 59 | Admitting: Internal Medicine

## 2016-09-16 ENCOUNTER — Encounter: Payer: Self-pay | Admitting: Internal Medicine

## 2016-09-16 VITALS — BP 124/76 | HR 93 | Temp 98.3°F | Resp 14 | Ht 61.0 in | Wt 154.5 lb

## 2016-09-16 DIAGNOSIS — J040 Acute laryngitis: Secondary | ICD-10-CM

## 2016-09-16 MED ORDER — AMOXICILLIN 500 MG PO CAPS: 1000.0000 mg | ORAL_CAPSULE | Freq: Two times a day (BID) | ORAL | 0 refills | Status: DC

## 2016-09-16 NOTE — Progress Notes (Signed)
Pre visit review using our clinic review tool, if applicable. No additional management support is needed unless otherwise documented below in the visit note. 

## 2016-09-16 NOTE — Progress Notes (Signed)
   Subjective:    Patient ID: Nyoka Lintamatha S Pearcy, female    DOB: 1966/09/14, 50 y.o.   MRN: [id number]  DOS:  09/16/2016 Type of visit - description : Acute Interval history: Symptoms started yesterday, chief concern is hoarseness. She had subjective fever and feeling cold. Some cough with yellow sputum. No sick contacts except her fiance had very mild URI symptoms.  Review of Systems Denies vomiting or diarrhea but had some nausea No rash No headache Minimal sinus congestion.   Past Medical History:  Diagnosis Date  . GERD (gastroesophageal reflux disease)   . Hypertension     Past Surgical History:  Procedure Laterality Date  . BTL post-bilateral ectopics    . ENDOMETRIAL ABLATION     ? Novasure  . TUBAL LIGATION      Social History   Social History  . Marital status: Single    Spouse name: N/A  . Number of children: N/A  . Years of education: N/A   Occupational History  . Not on file.   Social History Main Topics  . Smoking status: Never Smoker  . Smokeless tobacco: Not on file  . Alcohol use No  . Drug use: No  . Sexual activity: Yes    Birth control/ protection: Surgical   Other Topics Concern  . Not on file   Social History Narrative  . No narrative on file        Medication List       Accurate as of 09/16/16 11:59 PM. Always use your most recent med list.          amLODipine 5 MG tablet Commonly known as:  NORVASC Take 1 tablet (5 mg total) by mouth daily.   amoxicillin 500 MG capsule Commonly known as:  AMOXIL Take 2 capsules (1,000 mg total) by mouth 2 (two) times daily.   furosemide 20 MG tablet Commonly known as:  LASIX TAKE 2 TABLETS BY MOUTH EVERY DAY   losartan 100 MG tablet Commonly known as:  COZAAR TAKE 1 TABLET (100 MG TOTAL) BY MOUTH DAILY.   PRILOSEC OTC 20 MG tablet Generic drug:  omeprazole TAKE ONE CAPSULE BY MOUTH EVERY DAY          Objective:   Physical Exam BP 124/76 (BP Location: Left Arm, Patient  Position: Sitting, Cuff Size: Small)   Pulse 93   Temp 98.3 F (36.8 C) (Oral)   Resp 14   Ht 5\' 1"  (1.549 m)   Wt 154 lb 8 oz (70.1 kg)   SpO2 98%   BMI 29.19 kg/m  General:   Well developed, well nourished . NAD.  HEENT:  Normocephalic . Face symmetric, atraumatic. TMs normal, nose is slightly congested, sinuses no TTP. Throat symmetric, no red. + Hoarseness but no stridor Lungs:  CTA B Normal respiratory effort, no intercostal retractions, no accessory muscle use. Heart: RRR,  no murmur.  No pretibial edema bilaterally  Skin: Not pale. Not jaundice Neurologic:  alert & oriented X3.  Speech normal, gait appropriate for age and unassisted Psych--  Cognition and judgment appear intact.  Cooperative with normal attention span and concentration.  Behavior appropriate. No anxious or depressed appearing.      Assessment & Plan:   50 year old lady with HTN , GERD, history of tubal ligation, not a smoker: Presents with laryngitis. Recommend conservative treatment, if not better start amoxicillin. See instructions.

## 2016-09-16 NOTE — Patient Instructions (Signed)
Rest, fluids , tylenol  For cough:  Take Mucinex DM twice a day as needed until better  If nasal congestion: Use OTC Nasocort or Flonase : 2 nasal sprays on each side of the nose in the morning until you feel better    Avoid decongestants such as  Pseudoephedrine or phenylephrine     Take the antibiotic as prescribed  (Amoxicillin) IF NO BETTER IN 3-4 DAYS   Call if not gradually better over the next  2 weeks  Call anytime if the symptoms are severe

## 2016-10-02 ENCOUNTER — Telehealth: Payer: Self-pay | Admitting: Family Medicine

## 2016-10-02 MED ORDER — FLUCONAZOLE 150 MG PO TABS: 150.0000 mg | ORAL_TABLET | Freq: Once | ORAL | 0 refills | Status: AC

## 2016-10-02 NOTE — Telephone Encounter (Signed)
Medication filled to pharmacy as requested.  Per PCP in the future this message should go to the provider who treated.

## 2016-10-02 NOTE — Telephone Encounter (Signed)
error:[id number] ° °

## 2016-10-02 NOTE — Telephone Encounter (Signed)
Caller name: Relationship to patient: Self Can be reached: [phone]  Pharmacy:  [id number]- [place], [place] - [address] [phone] (Phone) [phone] (Fax)     Reason for call: Patient was given antibiotic by Dr. Drue NovelPaz and now needs a RX for Diflucan. Plse call patient when Rx sent to pharmacy

## 2016-10-27 ENCOUNTER — Telehealth: Payer: Self-pay | Admitting: Family Medicine

## 2016-10-27 NOTE — Telephone Encounter (Signed)
ok 

## 2016-10-27 NOTE — Telephone Encounter (Signed)
Ok to transfer. 

## 2016-10-27 NOTE — Telephone Encounter (Signed)
Patient request to transfer care from Dr Tabori to Dr Copland °

## 2016-10-27 NOTE — Telephone Encounter (Signed)
[facility] before [facility] left

## 2016-10-28 ENCOUNTER — Ambulatory Visit: Payer: 59 | Admitting: Medical

## 2016-10-28 ENCOUNTER — Emergency Department (HOSPITAL_BASED_OUTPATIENT_CLINIC_OR_DEPARTMENT_OTHER): Payer: 59

## 2016-10-28 ENCOUNTER — Encounter (HOSPITAL_BASED_OUTPATIENT_CLINIC_OR_DEPARTMENT_OTHER): Payer: Self-pay | Admitting: Emergency Medicine

## 2016-10-28 ENCOUNTER — Emergency Department (HOSPITAL_BASED_OUTPATIENT_CLINIC_OR_DEPARTMENT_OTHER)
Admission: EM | Admit: 2016-10-28 | Discharge: 2016-10-28 | Disposition: A | Discharge status: Home / Self Care | Payer: 59 | Attending: Emergency Medicine | Admitting: Emergency Medicine

## 2016-10-28 DIAGNOSIS — R0789 Other chest pain: Secondary | ICD-10-CM

## 2016-10-28 DIAGNOSIS — R0602 Shortness of breath: Secondary | ICD-10-CM | POA: Diagnosis present

## 2016-10-28 DIAGNOSIS — M79604 Pain in right leg: Secondary | ICD-10-CM | POA: Diagnosis not present

## 2016-10-28 DIAGNOSIS — I10 Essential (primary) hypertension: Secondary | ICD-10-CM | POA: Diagnosis not present

## 2016-10-28 LAB — BASIC METABOLIC PANEL
Anion gap: 6 (ref 5–15)
BUN: 9 mg/dL (ref 6–20)
CHLORIDE: 103 mmol/L (ref 101–111)
CO2: 26 mmol/L (ref 22–32)
CREATININE: 0.51 mg/dL (ref 0.44–1.00)
Calcium: 8.6 mg/dL — ABNORMAL LOW (ref 8.9–10.3)
GFR calc Af Amer: >60 mL/min (ref >60)
GFR calc non Af Amer: >60 mL/min (ref >60)
Glucose, Bld: 103 mg/dL — ABNORMAL HIGH (ref 65–99)
POTASSIUM: 3.3 mmol/L — AB (ref 3.5–5.1)
SODIUM: 135 mmol/L (ref 135–145)

## 2016-10-28 LAB — CBC
HEMATOCRIT: 36.9 % (ref 36.0–46.0)
Hemoglobin: 11.9 g/dL — ABNORMAL LOW (ref 12.0–15.0)
MCH: 27.7 pg (ref 26.0–34.0)
MCHC: 32.2 g/dL (ref 30.0–36.0)
MCV: 85.8 fL (ref 78.0–100.0)
PLATELETS: 254 10*3/uL (ref 150–400)
RBC: 4.3 MIL/uL (ref 3.87–5.11)
RDW: 13.4 % (ref 11.5–15.5)
WBC: 8.3 10*3/uL (ref 4.0–10.5)

## 2016-10-28 LAB — TROPONIN I: Troponin I: <0.03 ng/mL (ref <0.03)

## 2016-10-28 LAB — D-DIMER, QUANTITATIVE: D-Dimer, Quant: <0.27 ug/mL-FEU (ref 0.00–0.50)

## 2016-10-28 NOTE — ED Notes (Signed)
EKG given to Dr. Madilyn Hookees, pt taken to XR at this time.

## 2016-10-28 NOTE — ED Notes (Signed)
Patient transported to X-ray 

## 2016-10-28 NOTE — ED Provider Notes (Signed)
[facility]   CSN: [id number] Arrival date & time: 10/28/16  1410     History   Chief Complaint Chief Complaint  Patient presents with  . Leg Pain  . Chest Pain    HPI Angela Wall is a 51 y.o. female.  The history is provided by the patient. No language interpreter was used.  Leg Pain    Chest Pain   Associated symptoms include leg pain.    Angela Wall is a 51 y.o. female who presents to the Emergency Department complaining of leg pain, chest pain.  She reports 4 days of pain to the right calf that is described as cramping in nature. 2 days ago she had associated shortness of breath, cough and left chest discomfort. Overall her symptoms appear to be improving in terms of her chest pain but she does have ongoing right leg cramping. No fevers, vomiting, bowel pain, injuries. She has no history of DVT or PE. She has a family history of blood clots in her grandmother and her aunt. She does not take any hormones and is a nonsmoker. No recent surgeries.  Past Medical History:  Diagnosis Date  . GERD (gastroesophageal reflux disease)   . Hypertension     Patient Active Problem List   Diagnosis Date Noted  . Abnormal bruising 12/09/2015  . Pain in joint involving right ankle and foot 11/06/2015  . Maxillary sinusitis, acute 09/24/2015  . Cervical pain (neck) 08/21/2015  . Breast pain, left 05/23/2015  . Edema 05/23/2015  . Pain in joint, shoulder region 06/05/2014  . Knee pain, left 06/05/2014  . Insomnia 06/05/2014  . Routine general medical examination at a health care facility 11/09/2012  . Trapezius muscle spasm 11/09/2012  . Other abnormal glucose 03/14/2012  . Pure hypercholesterolemia 03/14/2012  . SPINAL STENOSIS, CERVICAL 03/27/2010  . Cervical spondylosis with myelopathy 03/19/2010  . LACTOSE INTOLERANCE 12/31/2009  . GERD 07/08/2009  . Essential hypertension 07/05/2009    Past Surgical History:  Procedure Laterality  Date  . BTL post-bilateral ectopics    . ENDOMETRIAL ABLATION     ? Novasure  . TUBAL LIGATION      OB History    No data available       Home Medications    Prior to Admission medications   Medication Sig Start Date End Date Taking? Authorizing Provider  amLODipine (NORVASC) 5 MG tablet Take 1 tablet (5 mg total) by mouth daily. 12/09/15   Sheliah HatchKatherine E Tabori, MD  amoxicillin (AMOXIL) 500 MG capsule Take 2 capsules (1,000 mg total) by mouth 2 (two) times daily. 09/16/16   Wanda PlumpJose E Paz, MD  furosemide (LASIX) 20 MG tablet TAKE 2 TABLETS BY MOUTH EVERY DAY 06/09/16   Sheliah HatchKatherine E Tabori, MD  losartan (COZAAR) 100 MG tablet TAKE 1 TABLET (100 MG TOTAL) BY MOUTH DAILY. 08/31/16   Sheliah HatchKatherine E Tabori, MD  PRILOSEC OTC 20 MG tablet TAKE ONE CAPSULE BY MOUTH EVERY DAY 09/23/15   Sheliah HatchKatherine E Tabori, MD    Family History Family History  Problem Relation Age of Onset  . Cancer Mother     Breast cancer <50  . Kidney disease Maternal Uncle   . Hypertension Other     Social History Social History  Substance Use Topics  . Smoking status: Never Smoker  . Smokeless tobacco: Not on file  . Alcohol use No     Allergies   Oxycodone-acetaminophen and Propoxyphene n-acetaminophen   Review of Systems Review  of Systems  Cardiovascular: Positive for chest pain.  All other systems reviewed and are negative.    Physical Exam Updated Vital Signs BP 137/88   Pulse 87   Temp 98.4 F (36.9 C) (Oral)   Resp 18   Ht 5' (1.524 m)   Wt 148 lb (67.1 kg)   SpO2 100%   BMI 28.90 kg/m   Physical Exam  Constitutional: She is oriented to person, place, and time. She appears well-developed and well-nourished.  HENT:  Head: Normocephalic and atraumatic.  Cardiovascular: Normal rate and regular rhythm.   No murmur heard. Pulmonary/Chest: Effort normal and breath sounds normal. No respiratory distress.  Left anterior chest wall tenderness to palpation  Abdominal: Soft. There is no tenderness.  There is no rebound and no guarding.  Musculoskeletal: She exhibits no edema.  2+ DP pulses bilaterally. Mild right calf tenderness to palpation with no cellulitis or abscess.  Neurological: She is alert and oriented to person, place, and time.  Skin: Skin is warm and dry.  Psychiatric: She has a normal mood and affect. Her behavior is normal.  Nursing note and vitals reviewed.    ED Treatments / Results  Labs (all labs ordered are listed, but only abnormal results are displayed) Labs Reviewed  BASIC METABOLIC PANEL - Abnormal; Notable for the following:       Result Value   Potassium 3.3 (*)    Glucose, Bld 103 (*)    Calcium 8.6 (*)    All other components within normal limits  CBC - Abnormal; Notable for the following:    Hemoglobin 11.9 (*)    All other components within normal limits  TROPONIN I  D-DIMER, QUANTITATIVE (NOT AT [facility])    EKG  EKG Interpretation  Date/Time:  Wednesday October 28 2016 14:33:36 EST Ventricular Rate:  86 PR Interval:  130 QRS Duration: 84 QT Interval:  384 QTC Calculation: 459 R Axis:   48 Text Interpretation:  Sinus rhythm with marked sinus arrhythmia Otherwise normal ECG Confirmed by Lincoln Brigham [phone]) on 10/28/2016 2:36:10 PM       Radiology Dg Chest 2 View  Result Date: 10/28/2016 CLINICAL DATA:  Chest pain, cough for 4 days EXAM: CHEST  2 VIEW COMPARISON:  None. FINDINGS: The heart size and mediastinal contours are within normal limits. Both lungs are clear. The visualized skeletal structures are unremarkable. IMPRESSION: No active cardiopulmonary disease. Electronically Signed   By: Natasha Mead M.D.   On: 10/28/2016 14:43   US Venous Img Lower Unilateral Right  Result Date: 10/28/2016 CLINICAL DATA:  Right calf pain and swelling for several days EXAM: Right LOWER EXTREMITY VENOUS DOPPLER ULTRASOUND TECHNIQUE: Gray-scale sonography with graded compression, as well as color Doppler and duplex ultrasound were performed to evaluate the  lower extremity deep venous systems from the level of the common femoral vein and including the common femoral, femoral, profunda femoral, popliteal and calf veins including the posterior tibial, peroneal and gastrocnemius veins when visible. The superficial great saphenous vein was also interrogated. Spectral Doppler was utilized to evaluate flow at rest and with distal augmentation maneuvers in the common femoral, femoral and popliteal veins. COMPARISON:  None. FINDINGS: Contralateral Common Femoral Vein: Respiratory phasicity is normal and symmetric with the symptomatic side. No evidence of thrombus. Normal compressibility. Common Femoral Vein: No evidence of thrombus. Normal compressibility, respiratory phasicity and response to augmentation. Saphenofemoral Junction: No evidence of thrombus. Normal compressibility and flow on color Doppler imaging. Profunda Femoral Vein: No evidence of  thrombus. Normal compressibility and flow on color Doppler imaging. Femoral Vein: No evidence of thrombus. Normal compressibility, respiratory phasicity and response to augmentation. Popliteal Vein: No evidence of thrombus. Normal compressibility, respiratory phasicity and response to augmentation. Calf Veins: No evidence of thrombus. Normal compressibility and flow on color Doppler imaging. Superficial Great Saphenous Vein: No evidence of thrombus. Normal compressibility and flow on color Doppler imaging. Venous Reflux:  None. Other Findings:  None. IMPRESSION: No evidence of deep venous thrombosis. Electronically Signed   By: Alcide Clever M.D.   On: 10/28/2016 16:00    Procedures Procedures (including critical care time)  Medications Ordered in ED Medications - No data to display   Initial Impression / Assessment and Plan / ED Course  I have reviewed the triage vital signs and the nursing notes.  Pertinent labs & imaging results that were available during my care of the patient were reviewed by me and considered in  my medical decision making (see chart for details).  Clinical Course     Patient here for evaluation of right calf pain as well as chest pain. Chest pain is reproducible on examination as this calf pain. Vascular ultrasound is negative for DVT and she is low risk by well's criteria, d-dimer negative. Discussed with patient care for chest wall pain as well as calf pain. Recommend rest, ibuprofen, and outpatient follow up and return impressions. Current clinical picture is not consistent with ACS, PE, pneumonia.  Final Clinical Impressions(s) / ED Diagnoses   Final diagnoses:  Chest wall pain  Right leg pain    New Prescriptions New Prescriptions   No medications on file     Tilden Fossa, MD 10/28/16 [phone]

## 2016-10-28 NOTE — ED Triage Notes (Signed)
Pt reports right posterior calf pain x3 days with no injury. Pt is concerned for blood clot, pt has swelling to right leg. Pt has had cp and sob but has none at this time.

## 2016-10-28 NOTE — ED Notes (Signed)
Found pulse on right foot via doppler.

## 2016-11-05 ENCOUNTER — Other Ambulatory Visit: Payer: Self-pay | Admitting: General Practice

## 2016-11-05 ENCOUNTER — Other Ambulatory Visit: Payer: Self-pay | Admitting: Family Medicine

## 2016-11-05 MED ORDER — AMLODIPINE BESYLATE 5 MG PO TABS: 5.0000 mg | ORAL_TABLET | Freq: Every day | ORAL | 0 refills | Status: DC

## 2016-12-08 ENCOUNTER — Other Ambulatory Visit: Payer: Self-pay | Admitting: Family Medicine

## 2016-12-08 NOTE — Telephone Encounter (Signed)
Pt sent in electronic refill request for BP meds, pt needs an office visit to have this filled. Per notes pt was changing PCP but I do not see where she was ever scheduled?

## 2016-12-09 NOTE — Telephone Encounter (Signed)
Left message on voicemail for patient to call office and schedule appointment

## 2016-12-09 NOTE — Telephone Encounter (Signed)
Please reach out to this patient and get her scheduled with Dr. Patsy Lageropland for next week please.

## 2016-12-11 LAB — HM PAP SMEAR

## 2016-12-14 ENCOUNTER — Other Ambulatory Visit: Payer: Self-pay

## 2016-12-14 MED ORDER — AMLODIPINE BESYLATE 5 MG PO TABS: 5.0000 mg | ORAL_TABLET | Freq: Every day | ORAL | 0 refills | Status: DC

## 2016-12-17 ENCOUNTER — Encounter: Payer: Self-pay | Admitting: General Practice

## 2017-01-08 ENCOUNTER — Other Ambulatory Visit: Payer: Self-pay | Admitting: Family Medicine

## 2017-01-11 ENCOUNTER — Other Ambulatory Visit: Payer: Self-pay | Admitting: Family Medicine

## 2017-01-16 ENCOUNTER — Other Ambulatory Visit: Payer: Self-pay | Admitting: Family Medicine

## 2017-01-19 NOTE — Progress Notes (Signed)
[facility] [address] [place], [place] [address] [phone]  Date:  01/20/2017   Name:  Angela Wall   DOB:  03-13-1966   MRN:  [id number]  PCP:  Neena Rhymes, MD    Chief Complaint: Establish Care   History of Present Illness:  Angela Wall is a 51 y.o. very pleasant female patient who presents with the following:  Here to see me today as a new patient.  Former pt of Dr. Beverely Low who has moved to a new location History of high cholesterol, edema, HTN, GERD She takes a couple of medications for her BP, lasix for edema She notes that she always feel cold- she has noted this for most of her life.  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  No grands as of yet.   Lab Results  Component Value Date   TSH 0.82 11/03/2013  she uses zantac 150 once daily as needed- she stopped using her PPI as she was concerned about possible long term risks  Admits that she does not generally check her BP at home  Never a smoker  She is not fasting today- she ate a bacon egg and cheese sndwich  She was in the ER in January with CP- eval was benign and this has not recurred   Patient Active Problem List   Diagnosis Date Noted  . Abnormal bruising 12/09/2015  . Pain in joint involving right ankle and foot 11/06/2015  . Breast pain, left 05/23/2015  . Edema 05/23/2015  . Pain in joint, shoulder region 06/05/2014  . Knee pain, left 06/05/2014  . Insomnia 06/05/2014  . Other abnormal glucose 03/14/2012  . Pure hypercholesterolemia 03/14/2012  . SPINAL STENOSIS, CERVICAL 03/27/2010  . Cervical spondylosis with myelopathy 03/19/2010  . LACTOSE INTOLERANCE 12/31/2009  . GERD 07/08/2009  . Essential hypertension 07/05/2009     Past Medical History:  Diagnosis Date  . GERD (gastroesophageal reflux disease)   . Hypertension     Past Surgical History:  Procedure Laterality Date  . BTL post-bilateral ectopics    . ENDOMETRIAL ABLATION     ? Novasure  . TUBAL LIGATION      Social History  Substance Use Topics  . Smoking status: Never Smoker  . Smokeless tobacco: Never Used  . Alcohol use No    Family History  Problem Relation Age of Onset  . Cancer Mother     Breast cancer <50  . Kidney disease Maternal Uncle   . Hypertension Other     Allergies  Allergen Reactions  . Oxycodone-Acetaminophen     REACTION: Itching  . Propoxyphene N-Acetaminophen     REACTION: Itching    Medication list has been reviewed and updated.  Current Outpatient Prescriptions on File Prior to Visit  Medication Sig Dispense Refill  . PRILOSEC OTC 20 MG tablet TAKE ONE CAPSULE BY MOUTH EVERY DAY 84 tablet 1   No current facility-administered medications on file prior to visit.     Review of Systems: No fever, chills, SOB, CP, nausea, vomiting, rash, diarrhea  As per HPI- otherwise negative.   Physical Examination: Vitals:   01/20/17 1246  BP: 133/88  Pulse: 91  Temp: 98.6  F (37 C)   Vitals:   01/20/17 1246  Weight: 152 lb 3.2 oz (69 kg)  Height: 5' (1.524 m)   Body mass index is 29.72 kg/m. Ideal Body Weight: Weight in (lb) to have BMI = 25: 127.7  GEN: WDWN, NAD, Non-toxic, A & O x 3, overweight but otherwise looks very well HEENT: Atraumatic, Normocephalic. Neck supple. No masses, No LAD.  Bilateral TM wnl, oropharynx normal.  PEERL,EOMI.   Ears and Nose: No external deformity. CV: RRR, No M/G/R. No JVD. No thrill. No extra heart sounds. PULM: CTA B, no wheezes, crackles, rhonchi. No retractions. No resp. distress. No accessory muscle use. ABD: S, NT, ND EXTR: No c/c/e NEURO Normal gait.  PSYCH: Normally interactive. Conversant. Not depressed or anxious appearing.  Calm demeanor.     Assessment and Plan: Hypertension, essential  Cold feeling - Plan: TSH, CBC  Gastroesophageal reflux disease, esophagitis presence not specified - Plan: ranitidine (ZANTAC) 150 MG capsule  Screening for hyperlipidemia - Plan: Lipid panel  Screening for diabetes mellitus - Plan: Comprehensive metabolic panel, Hemoglobin A1c    Signed Abbe AmsterdamJessica Airis Barbee, MD

## 2017-01-20 ENCOUNTER — Ambulatory Visit (INDEPENDENT_AMBULATORY_CARE_PROVIDER_SITE_OTHER): Payer: 59 | Admitting: Family Medicine

## 2017-01-20 ENCOUNTER — Encounter: Payer: Self-pay | Admitting: Family Medicine

## 2017-01-20 VITALS — BP 133/88 | HR 91 | Temp 98.6°F | Ht 60.0 in | Wt 152.2 lb

## 2017-01-20 DIAGNOSIS — Z131 Encounter for screening for diabetes mellitus: Secondary | ICD-10-CM

## 2017-01-20 DIAGNOSIS — R6889 Other general symptoms and signs: Secondary | ICD-10-CM | POA: Diagnosis not present

## 2017-01-20 DIAGNOSIS — I10 Essential (primary) hypertension: Secondary | ICD-10-CM

## 2017-01-20 DIAGNOSIS — Z13220 Encounter for screening for lipoid disorders: Secondary | ICD-10-CM

## 2017-01-20 DIAGNOSIS — K219 Gastro-esophageal reflux disease without esophagitis: Secondary | ICD-10-CM | POA: Diagnosis not present

## 2017-01-20 LAB — TSH: TSH: 1.43 u[IU]/mL (ref 0.35–4.50)

## 2017-01-20 LAB — COMPREHENSIVE METABOLIC PANEL
ALT: 9 U/L (ref 0–35)
AST: 12 U/L (ref 0–37)
Albumin: 4.3 g/dL (ref 3.5–5.2)
Alkaline Phosphatase: 79 U/L (ref 39–117)
BILIRUBIN TOTAL: 0.9 mg/dL (ref 0.2–1.2)
BUN: 14 mg/dL (ref 6–23)
CALCIUM: 9.7 mg/dL (ref 8.4–10.5)
CHLORIDE: 103 meq/L (ref 96–112)
CO2: 30 meq/L (ref 19–32)
Creatinine, Ser: 0.8 mg/dL (ref 0.40–1.20)
GFR: 98.12 mL/min (ref >60.00)
Glucose, Bld: 97 mg/dL (ref 70–99)
Potassium: 3.8 mEq/L (ref 3.5–5.1)
Sodium: 139 mEq/L (ref 135–145)
Total Protein: 7.2 g/dL (ref 6.0–8.3)

## 2017-01-20 LAB — LIPID PANEL
CHOL/HDL RATIO: 3
Cholesterol: 145 mg/dL (ref 0–200)
HDL: 54.5 mg/dL (ref >39.00)
LDL Cholesterol: 80 mg/dL (ref 0–99)
NonHDL: 90.45
Triglycerides: 54 mg/dL (ref 0.0–149.0)
VLDL: 10.8 mg/dL (ref 0.0–40.0)

## 2017-01-20 LAB — CBC
HCT: 38.7 % (ref 36.0–46.0)
Hemoglobin: 12.8 g/dL (ref 12.0–15.0)
MCHC: 33.2 g/dL (ref 30.0–36.0)
MCV: 85 fl (ref 78.0–100.0)
PLATELETS: 280 10*3/uL (ref 150.0–400.0)
RBC: 4.55 Mil/uL (ref 3.87–5.11)
RDW: 13.2 % (ref 11.5–15.5)
WBC: 8.9 10*3/uL (ref 4.0–10.5)

## 2017-01-20 LAB — HEMOGLOBIN A1C: Hgb A1c MFr Bld: 5.7 % (ref 4.6–6.5)

## 2017-01-20 MED ORDER — RANITIDINE HCL 150 MG PO CAPS: 150.0000 mg | ORAL_CAPSULE | Freq: Every day | ORAL | 3 refills | Status: DC | PRN

## 2017-01-20 MED ORDER — FUROSEMIDE 20 MG PO TABS: 40.0000 mg | ORAL_TABLET | Freq: Every day | ORAL | 1 refills | Status: DC

## 2017-01-20 MED ORDER — LOSARTAN POTASSIUM 100 MG PO TABS: 100.0000 mg | ORAL_TABLET | Freq: Every day | ORAL | 1 refills | Status: DC

## 2017-01-20 MED ORDER — AMLODIPINE BESYLATE 5 MG PO TABS: 5.0000 mg | ORAL_TABLET | Freq: Every day | ORAL | 1 refills | Status: DC

## 2017-01-20 NOTE — Patient Instructions (Signed)
Your blood pressure looks fine today- continue your current medications We will check labs today and I will be in touch asap

## 2017-01-20 NOTE — Progress Notes (Signed)
Pre visit review using our clinic review tool, if applicable. No additional management support is needed unless otherwise documented below in the visit note. 

## 2017-03-17 ENCOUNTER — Other Ambulatory Visit: Payer: Self-pay | Admitting: Family Medicine

## 2017-03-18 ENCOUNTER — Other Ambulatory Visit: Payer: Self-pay | Admitting: Family Medicine

## 2017-05-25 NOTE — Progress Notes (Signed)
Angela Wall [address], [address] [place], [place] [address] [phone]  Date:  05/26/2017   Name:  Angela Wall   DOB:  12-Aug-1966   MRN:  [id number]  PCP:  Pearline Cables, MD    Chief Complaint: Foot Pain (c/o bilateral foot pain. Pt states that pain has been worse this past week. )   History of Present Illness:  Angela Wall is a 51 y.o. very pleasant female patient who presents with the following:  History of spinal stenosis, GERD, HTN Last seen by myself in March of this year:  She takes a couple of medications for her BP, lasix for edema She notes that she always feel cold- she has noted this for most of her life.  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  No grands as of yet.    Here today with concern of foot pain She has noted some swelling and pain in both her feet and lower legs She does use lasix for edema but her swelling is not normally this bad Her feet hurt equally Pain is not worse any particular time of day She has noted constant foot pain for about 10 days   Pt also notes that she had CP for "a few hours" last night- she went to sleep and it is now resolved.  Occurred while at rest- no CP now She was seen in the ER for chest pain back in January- her eval was negative at that time This recent episodeseems like the same CP to her  She denies any personal history of heart concerns Her GM did have atrial fibrillation but otherwise no family history that she can recall  Wt Readings from Last 3 Encounters:  05/26/17 157 lb 12.8 oz (71.6 kg)  01/20/17 152 lb 3.2 oz (69 kg)  10/28/16 148 lb (67.1 kg)   Her weight is up a bit recently- as above She may have SOB when she lays down- she may have  to prop up on pillows or sit up to catch her breath.  This is worse over the last 2 weeks  Never had any history of CHF Patient Active Problem List   Diagnosis Date Noted  . Abnormal bruising 12/09/2015  . Pain in joint involving right ankle and foot 11/06/2015  . Breast pain, left 05/23/2015  . Edema 05/23/2015  . Pain in joint, shoulder region 06/05/2014  . Knee pain, left 06/05/2014  . Insomnia 06/05/2014  . Other abnormal glucose 03/14/2012  . Pure hypercholesterolemia 03/14/2012  . SPINAL STENOSIS, CERVICAL 03/27/2010  . Cervical spondylosis with myelopathy 03/19/2010  . LACTOSE INTOLERANCE 12/31/2009  . GERD 07/08/2009  . Essential hypertension 07/05/2009    Past Medical History:  Diagnosis Date  . GERD (gastroesophageal reflux disease)   . Hypertension     Past Surgical History:  Procedure Laterality Date  . BTL post-bilateral ectopics    . ENDOMETRIAL ABLATION     ? Novasure  . TUBAL LIGATION      Social History  Substance Use Topics  . Smoking status: Never Smoker  . Smokeless tobacco: Never Used  . Alcohol use No    Family History  Problem Relation Age of Onset  .  Cancer Mother        Breast cancer <50  . Kidney disease Maternal Uncle   . Hypertension Other     Allergies  Allergen Reactions  . Oxycodone-Acetaminophen     REACTION: Itching  . Propoxyphene N-Acetaminophen     REACTION: Itching    Medication list has been reviewed and updated.  Current Outpatient Prescriptions on File Prior to Visit  Medication Sig Dispense Refill  . amLODipine (NORVASC) 5 MG tablet Take 1 tablet (5 mg total) by mouth daily. 90 tablet 1  . furosemide (LASIX) 20 MG tablet Take 2 tablets (40 mg total) by mouth daily. 180 tablet 1  . losartan (COZAAR) 100 MG tablet TAKE 1 TABLET BY MOUTH EVERY DAY 30 tablet 3  . ranitidine (ZANTAC) 150 MG capsule Take 1 capsule (150 mg total) by mouth daily as needed for heartburn. 90 capsule 3   No current facility-administered  medications on file prior to visit.     Review of Systems:  As per HPI- otherwise negative. She had an ablation and does not get menses any longer - no concern of pregnancy (also history of BTL) No fever or chills  Physical Examination: Vitals:   05/26/17 1532  BP: 124/82  Pulse: 98  Temp: 99.1 F (37.3 C)   Vitals:   05/26/17 1532  Weight: 157 lb 12.8 oz (71.6 kg)  Height: 4' 11.75" (1.518 m)   Body mass index is 31.08 kg/m. Ideal Body Weight: Weight in (lb) to have BMI = 25: 126.7  GEN: WDWN, NAD, Non-toxic, A & O x 3, overweight, looks well HEENT: Atraumatic, Normocephalic. Neck supple. No masses, No LAD.  Bilateral TM wnl, oropharynx normal.  PEERL,EOMI.   Ears and Nose: No external deformity. CV: RRR, No M/G/R. No JVD. No thrill. No extra heart sounds. PULM: CTA B, no wheezes, crackles, rhonchi. No retractions. No resp. distress. No accessory muscle use. I am easily able to reproduce her CP by pressing on her chest wall today ABD: S, NT, ND, +BS. No rebound. No HSM. EXTR: No c/c.  Edema of the lower legs but not of her feet- swelling seems to start at the ankle.  Swelling is equal in BLE NEURO Normal gait.  PSYCH: Normally interactive. Conversant. Not depressed or anxious appearing.  Calm demeanor.   EKG: NSR, no significant change from previous   Dg Chest 2 View  Result Date: 05/26/2017 CLINICAL DATA:  Shortness of breath and dizziness.  Chest pain EXAM: CHEST  2 VIEW COMPARISON:  October 28, 2016 FINDINGS: Lungs are clear. Heart size and pulmonary vascularity are normal. No adenopathy. No pneumothorax. No bone lesions. IMPRESSION: No abnormality noted. Electronically Signed   By: Bretta BangWilliam  Woodruff III M.D.   On: 05/26/2017 16:44   Assessment and Plan: Chest pain at rest - Plan: EKG 12-Lead  Shortness of breath - Plan: DG Chest 2 View, B Nat Peptide  Localized swelling of lower extremity - Plan: B Nat Peptide, Comprehensive metabolic panel  Weight gain - Plan:  TSH  Screening for diabetes mellitus - Plan: Hemoglobin A1c  Here today with concern of worsening swelling of her legs, orthopnea, weight gain- begin eval for possible CHF with BNP, CXR today CXR is reassuring, await labs as above She did have CP last night, but this is now resolved, EKG is normal and her pain is easily reproducible- suspect MSK pain She is instructed to seek emergency care if her CP comes back or if any other symptoms appear or  get worse   Signed Abbe AmsterdamJessica Sunset Joshi, MD

## 2017-05-26 ENCOUNTER — Ambulatory Visit (HOSPITAL_BASED_OUTPATIENT_CLINIC_OR_DEPARTMENT_OTHER)
Admission: RE | Admit: 2017-05-26 | Discharge: 2017-05-26 | Disposition: A | Discharge status: Home / Self Care | Payer: 59 | Source: Ambulatory Visit | Attending: Family Medicine | Admitting: Family Medicine

## 2017-05-26 ENCOUNTER — Ambulatory Visit (INDEPENDENT_AMBULATORY_CARE_PROVIDER_SITE_OTHER): Payer: 59 | Admitting: Family Medicine

## 2017-05-26 VITALS — BP 124/82 | HR 98 | Temp 99.1°F | Ht 59.75 in | Wt 157.8 lb

## 2017-05-26 DIAGNOSIS — R079 Chest pain, unspecified: Secondary | ICD-10-CM | POA: Diagnosis not present

## 2017-05-26 DIAGNOSIS — R635 Abnormal weight gain: Secondary | ICD-10-CM

## 2017-05-26 DIAGNOSIS — Z131 Encounter for screening for diabetes mellitus: Secondary | ICD-10-CM

## 2017-05-26 DIAGNOSIS — M7989 Other specified soft tissue disorders: Secondary | ICD-10-CM

## 2017-05-26 DIAGNOSIS — R0602 Shortness of breath: Secondary | ICD-10-CM

## 2017-05-26 NOTE — Patient Instructions (Signed)
Your EKG looks ok today- unchanged Please go to the lab and then to the ground floor for a chest x-ray. I am going to look for any sign of heart failure or other reason why you have this swelling Your chest pain is reproducibly by pressing on your chest wall today which is reassuring.  However please do seek care right away if your chest pain comes back

## 2017-05-27 ENCOUNTER — Encounter: Payer: Self-pay | Admitting: Family Medicine

## 2017-05-27 LAB — COMPREHENSIVE METABOLIC PANEL
ALT: 10 U/L (ref 0–35)
AST: 14 U/L (ref 0–37)
Albumin: 4.2 g/dL (ref 3.5–5.2)
Alkaline Phosphatase: 68 U/L (ref 39–117)
BUN: 11 mg/dL (ref 6–23)
CHLORIDE: 104 meq/L (ref 96–112)
CO2: 26 meq/L (ref 19–32)
CREATININE: 0.74 mg/dL (ref 0.40–1.20)
Calcium: 9.1 mg/dL (ref 8.4–10.5)
GFR: 107.2 mL/min (ref >60.00)
Glucose, Bld: 97 mg/dL (ref 70–99)
Potassium: 3.8 mEq/L (ref 3.5–5.1)
SODIUM: 136 meq/L (ref 135–145)
Total Bilirubin: 1 mg/dL (ref 0.2–1.2)
Total Protein: 7 g/dL (ref 6.0–8.3)

## 2017-05-27 LAB — TSH: TSH: 1.42 u[IU]/mL (ref 0.35–4.50)

## 2017-05-27 LAB — BRAIN NATRIURETIC PEPTIDE: Pro B Natriuretic peptide (BNP): 32 pg/mL (ref 0.0–100.0)

## 2017-05-27 LAB — HEMOGLOBIN A1C: Hgb A1c MFr Bld: 5.7 % (ref 4.6–6.5)

## 2017-07-11 ENCOUNTER — Other Ambulatory Visit: Payer: Self-pay | Admitting: Family Medicine

## 2017-12-06 ENCOUNTER — Ambulatory Visit: Payer: 59 | Admitting: Family Medicine

## 2017-12-06 ENCOUNTER — Encounter: Payer: Self-pay | Admitting: Family Medicine

## 2017-12-06 ENCOUNTER — Ambulatory Visit (HOSPITAL_BASED_OUTPATIENT_CLINIC_OR_DEPARTMENT_OTHER)
Admission: RE | Admit: 2017-12-06 | Discharge: 2017-12-06 | Disposition: A | Discharge status: Home / Self Care | Payer: 59 | Source: Ambulatory Visit | Attending: Family Medicine | Admitting: Family Medicine

## 2017-12-06 VITALS — BP 130/88 | HR 89 | Temp 98.8°F | Ht 59.75 in | Wt 161.8 lb

## 2017-12-06 DIAGNOSIS — M7989 Other specified soft tissue disorders: Secondary | ICD-10-CM | POA: Diagnosis not present

## 2017-12-06 DIAGNOSIS — R52 Pain, unspecified: Secondary | ICD-10-CM | POA: Diagnosis present

## 2017-12-06 DIAGNOSIS — Z5181 Encounter for therapeutic drug level monitoring: Secondary | ICD-10-CM

## 2017-12-06 LAB — POCT INFLUENZA A/B
INFLUENZA A, POC: NEGATIVE
INFLUENZA B, POC: NEGATIVE

## 2017-12-06 LAB — BASIC METABOLIC PANEL
BUN: 9 mg/dL (ref 6–23)
CALCIUM: 9.5 mg/dL (ref 8.4–10.5)
CO2: 29 mEq/L (ref 19–32)
Chloride: 104 mEq/L (ref 96–112)
Creatinine, Ser: 0.74 mg/dL (ref 0.40–1.20)
GFR: 106.96 mL/min (ref >60.00)
GLUCOSE: 104 mg/dL — AB (ref 70–99)
POTASSIUM: 4.2 meq/L (ref 3.5–5.1)
SODIUM: 140 meq/L (ref 135–145)

## 2017-12-06 MED ORDER — FUROSEMIDE 20 MG PO TABS: 40.0000 mg | ORAL_TABLET | Freq: Every day | ORAL | 3 refills | Status: DC

## 2017-12-06 MED ORDER — OSELTAMIVIR PHOSPHATE 75 MG PO CAPS: 75.0000 mg | ORAL_CAPSULE | Freq: Two times a day (BID) | ORAL | 0 refills | Status: DC

## 2017-12-06 NOTE — Patient Instructions (Signed)
Go to lab, and then to x-ray. I will contact you later today- assuming your x-ray does not show pneumonia we will treat you for the flu. If there is pneumonia I will rx an antibiotic instead

## 2017-12-06 NOTE — Progress Notes (Signed)
[facility] [address], [address] [place], [place] [address] [phone]  Date:  12/06/2017   Name:  Angela Wall   DOB:  Mar 10, 1966   MRN:  [id number]  PCP:  Pearline Cablesopland, Jessica C, MD    Chief Complaint: Generalized Body Aches (c/o body aches, headache, cough, fever of 101 last night. )   History of Present Illness:  Angela Wall is a 52 y.o. very pleasant female patient who presents with the following:  Here today with flu like sx Today is Monday On Friday she had a cough- did not seem that bad, but then she got sicker over the weekend Yesterday she ran a temp of 101 Body aches, chills, exhaustion No sick contacts at home  No vomiting, a little diarrhea but she has been taking some OTC meds for cough and cold and thinks this is the cause No rash She is generally in good health   She does take lasix 40 daily for chronic swelling of her LE. This is helpful to her but she needs it refilled today  Patient Active Problem List   Diagnosis Date Noted  . Abnormal bruising 12/09/2015  . Pain in joint involving right ankle and foot 11/06/2015  . Breast pain, left 05/23/2015  . Edema 05/23/2015  . Pain in joint, shoulder region 06/05/2014  . Knee pain, left 06/05/2014  . Insomnia 06/05/2014  . Other abnormal glucose 03/14/2012  . Pure hypercholesterolemia 03/14/2012  . SPINAL STENOSIS, CERVICAL 03/27/2010  . Cervical spondylosis with myelopathy 03/19/2010  . LACTOSE INTOLERANCE 12/31/2009  . GERD 07/08/2009  . Essential hypertension 07/05/2009    Past Medical History:  Diagnosis Date  . GERD (gastroesophageal reflux disease)   . Hypertension     Past Surgical History:  Procedure Laterality Date  . BTL post-bilateral ectopics    . ENDOMETRIAL ABLATION     ? Novasure  . TUBAL LIGATION      Social History   Tobacco Use  . Smoking status: Never Smoker  . Smokeless tobacco: Never Used  Substance Use  Topics  . Alcohol use: No  . Drug use: No    Family History  Problem Relation Age of Onset  . Cancer Mother        Breast cancer <50  . Kidney disease Maternal Uncle   . Hypertension Other     Allergies  Allergen Reactions  . Oxycodone-Acetaminophen     REACTION: Itching  . Propoxyphene N-Acetaminophen     REACTION: Itching    Medication list has been reviewed and updated.  Current Outpatient Medications on File Prior to Visit  Medication Sig Dispense Refill  . amLODipine (NORVASC) 5 MG tablet TAKE 1 TABLET (5 MG TOTAL) BY MOUTH DAILY. 90 tablet 1  . furosemide (LASIX) 20 MG tablet Take 2 tablets (40 mg total) by mouth daily. 180 tablet 1  . losartan (COZAAR) 100 MG tablet TAKE 1 TABLET BY MOUTH EVERY DAY 30 tablet 3  . ranitidine (ZANTAC) 150 MG capsule Take 1 capsule (150 mg total) by mouth daily as needed for heartburn. 90 capsule 3   No current facility-administered medications on file prior to visit.     Review of Systems:  As per HPI- otherwise negative.   Physical Examination: Vitals:   12/06/17 1309  BP: 130/88  Pulse: 89  Temp: 98.8 F (37.1 C)  SpO2: 99%   Vitals:   12/06/17 1309  Weight: 161 lb 12.8  oz (73.4 kg)  Height: 4' 11.75" (1.518 m)   Body mass index is 31.86 kg/m. Ideal Body Weight: Weight in (lb) to have BMI = 25: 126.7  GEN: WDWN, NAD, Non-toxic, A & O x 3, overweight, looks well  HEENT: Atraumatic, Normocephalic. Neck supple. No masses, No LAD.  Bilateral TM wnl, oropharynx normal.  PEERL,EOMI.   Ears and Nose: No external deformity. CV: RRR, No M/G/R. No JVD. No thrill. No extra heart sounds. PULM: CTA B, no wheezes, crackles, rhonchi. No retractions. No resp. distress. No accessory muscle use. ABD: S, NT, ND EXTR: No c/c/e NEURO Normal gait.  PSYCH: Normally interactive. Conversant. Not depressed or anxious appearing.  Calm demeanor.   Results for orders placed or performed in visit on 12/06/17  POCT Influenza A/B  Result  Value Ref Range   Influenza A, POC Negative Negative   Influenza B, POC Negative Negative   Dg Chest 2 View  Result Date: 12/06/2017 CLINICAL DATA:  Flu like symptoms for 4 days EXAM: CHEST  2 VIEW COMPARISON:  05/26/2017 FINDINGS: The heart size and mediastinal contours are within normal limits. Both lungs are clear. The visualized skeletal structures are unremarkable. IMPRESSION: No active cardiopulmonary disease. Electronically Signed   By: Elige Ko   On: 12/06/2017 14:09     Assessment and Plan: Body aches - Plan: POCT Influenza A/B, oseltamivir (TAMIFLU) 75 MG capsule, DG Chest 2 View  Medication monitoring encounter - Plan: Basic metabolic panel, furosemide (LASIX) 20 MG tablet  Localized swelling of lower extremity  Suspect flu although rapid negative.  Will check a CXR- assuming negative treat with tamiflu Also refilled her lasix and got a BMP for her today Message to pt about negative CXR Will plan further follow- up pending labs.   Signed Abbe Amsterdam, MD

## 2017-12-08 ENCOUNTER — Telehealth: Payer: Self-pay | Admitting: Family Medicine

## 2017-12-08 NOTE — Telephone Encounter (Signed)
Please advise 

## 2017-12-08 NOTE — Telephone Encounter (Signed)
Gave her a call back She is overall feeling better, but she is now nauseated This may be from the tamiflu- advised that she can stop it if need be  She does not think she is able to RTW tomorrow Will extend her work note to cover the rest of this week- will put into mychart for her  She will alert me if not feeling better by this weekend

## 2017-12-08 NOTE — Telephone Encounter (Signed)
Copied from CRM [id number]. Topic: Quick Communication - See Telephone Encounter >> Dec 08, 2017  2:34 PM Arlyss Gandyichardson, Jakobie Henslee N, NT wrote: CRM for notification. See Telephone encounter for: Pt wants to speak with Dr. Patsy Lageropland regarding that she is still not feeling well from the flu and doesn't think she will be able to go back to work tomorrow. She would like a call to discuss how she is feeling.   12/08/17.

## 2018-01-08 ENCOUNTER — Other Ambulatory Visit: Payer: Self-pay | Admitting: Family Medicine

## 2018-02-23 ENCOUNTER — Ambulatory Visit: Payer: Self-pay | Admitting: *Deleted

## 2018-02-23 NOTE — Telephone Encounter (Signed)
FYI to PCP

## 2018-02-23 NOTE — Telephone Encounter (Signed)
Patient is calling to report she has swelling in her legs and feet that is not getting better. She is taking her medications with no relief. Patient also states she has slight chest pain and some trouble at night catching her breath to sleep. Advised per protocol to go to ED for evaluation.  Reason for Disposition . Difficulty breathing at rest  Answer Assessment - Initial Assessment Questions 1. ONSET: "When did the swelling start?" (e.g., minutes, hours, days)     Sunday/Monday- extra swelling 2. LOCATION: "What part of the leg is swollen?"  "Are both legs swollen or just one leg?"     Knee and below - R leg is bigger than L, both ankles and behind (heel region) painful to walk 3. SEVERITY: "How bad is the swelling?" (e.g., localized; mild, moderate, severe)  - Localized - small area of swelling localized to one leg  - MILD pedal edema - swelling limited to foot and ankle, pitting edema < 1/4 inch (6 mm) deep, rest and elevation eliminate most or all swelling  - MODERATE edema - swelling of lower leg to knee, pitting edema > 1/4 inch (6 mm) deep, rest and elevation only partially reduce swelling  - SEVERE edema - swelling extends above knee, facial or hand swelling present      moderate 4. REDNESS: "Does the swelling look red or infected?"     Not red or infected - but some redness- located mainly on ankles 5. PAIN: "Is the swelling painful to touch?" If so, ask: "How painful is it?"   (Scale 1-10; mild, moderate or severe)     Not as bad today- knee was very sore yesterday- 4 6. FEVER: "Do you have a fever?" If so, ask: "What is it, how was it measured, and when did it start?"      no 7. CAUSE: "What do you think is causing the leg swelling?"     Patient is not sure- she has been taking her medication- she does walk alot 8. MEDICAL HISTORY: "Do you have a history of heart failure, kidney disease, liver failure, or cancer?"     hypertension 9. RECURRENT SYMPTOM: "Have you had leg swelling  before?" If so, ask: "When was the last time?" "What happened that time?"     Never this bad- not getting better- elevation did not help 10. OTHER SYMPTOMS: "Do you have any other symptoms?" (e.g., chest pain, difficulty breathing)       Patient reports a little pain on her L side of her chest- sticking pain. Patient did have problems getting air while trying to sleep 2 nights ago 11. PREGNANCY: "Is there any chance you are pregnant?" "When was your last menstrual period?"       No- ablation  Protocols used: LEG SWELLING AND EDEMA-A-AH

## 2018-02-24 ENCOUNTER — Ambulatory Visit: Payer: 59 | Admitting: Family Medicine

## 2018-02-24 ENCOUNTER — Emergency Department (HOSPITAL_BASED_OUTPATIENT_CLINIC_OR_DEPARTMENT_OTHER): Payer: 59

## 2018-02-24 ENCOUNTER — Encounter: Payer: Self-pay | Admitting: Family Medicine

## 2018-02-24 ENCOUNTER — Encounter (HOSPITAL_BASED_OUTPATIENT_CLINIC_OR_DEPARTMENT_OTHER): Payer: Self-pay | Admitting: *Deleted

## 2018-02-24 ENCOUNTER — Emergency Department (HOSPITAL_BASED_OUTPATIENT_CLINIC_OR_DEPARTMENT_OTHER)
Admission: EM | Admit: 2018-02-24 | Discharge: 2018-02-24 | Disposition: A | Discharge status: Home / Self Care | Payer: 59 | Attending: Emergency Medicine | Admitting: Emergency Medicine

## 2018-02-24 ENCOUNTER — Other Ambulatory Visit: Payer: Self-pay

## 2018-02-24 VITALS — BP 112/82 | HR 94 | Temp 98.3°F | Ht 59.5 in | Wt 165.8 lb

## 2018-02-24 DIAGNOSIS — R0789 Other chest pain: Secondary | ICD-10-CM | POA: Insufficient documentation

## 2018-02-24 DIAGNOSIS — R0602 Shortness of breath: Secondary | ICD-10-CM | POA: Diagnosis not present

## 2018-02-24 DIAGNOSIS — Z79899 Other long term (current) drug therapy: Secondary | ICD-10-CM | POA: Diagnosis not present

## 2018-02-24 DIAGNOSIS — M7989 Other specified soft tissue disorders: Secondary | ICD-10-CM

## 2018-02-24 DIAGNOSIS — R079 Chest pain, unspecified: Secondary | ICD-10-CM

## 2018-02-24 DIAGNOSIS — M79672 Pain in left foot: Secondary | ICD-10-CM | POA: Diagnosis not present

## 2018-02-24 DIAGNOSIS — R2243 Localized swelling, mass and lump, lower limb, bilateral: Secondary | ICD-10-CM | POA: Insufficient documentation

## 2018-02-24 DIAGNOSIS — I10 Essential (primary) hypertension: Secondary | ICD-10-CM | POA: Diagnosis not present

## 2018-02-24 LAB — CBC
HEMATOCRIT: 38.1 % (ref 36.0–46.0)
Hemoglobin: 13.1 g/dL (ref 12.0–15.0)
MCH: 28.9 pg (ref 26.0–34.0)
MCHC: 34.4 g/dL (ref 30.0–36.0)
MCV: 83.9 fL (ref 78.0–100.0)
Platelets: 277 10*3/uL (ref 150–400)
RBC: 4.54 MIL/uL (ref 3.87–5.11)
RDW: 13.8 % (ref 11.5–15.5)
WBC: 8.1 10*3/uL (ref 4.0–10.5)

## 2018-02-24 LAB — BASIC METABOLIC PANEL
ANION GAP: 8 (ref 5–15)
BUN: 12 mg/dL (ref 6–20)
CALCIUM: 8.9 mg/dL (ref 8.9–10.3)
CO2: 24 mmol/L (ref 22–32)
Chloride: 103 mmol/L (ref 101–111)
Creatinine, Ser: 0.73 mg/dL (ref 0.44–1.00)
Glucose, Bld: 98 mg/dL (ref 65–99)
POTASSIUM: 4.2 mmol/L (ref 3.5–5.1)
Sodium: 135 mmol/L (ref 135–145)

## 2018-02-24 LAB — TROPONIN I

## 2018-02-24 LAB — D-DIMER, QUANTITATIVE (NOT AT ARMC): D DIMER QUANT: 0.44 ug{FEU}/mL (ref 0.00–0.50)

## 2018-02-24 LAB — PREGNANCY, URINE: PREG TEST UR: NEGATIVE

## 2018-02-24 MED ORDER — GI COCKTAIL ~~LOC~~
30.0000 mL | Freq: Once | ORAL | Status: AC
  Administered 2018-02-24: 30 mL via ORAL
  Filled 2018-02-24: qty 30

## 2018-02-24 NOTE — ED Triage Notes (Signed)
Epigastric pain into her left arm. Swelling in her legs and ankles. Symptoms x 3 days. She was seen by her MD today and had an EKG. She was told to come here for testing that could not be done in the office.

## 2018-02-24 NOTE — Progress Notes (Signed)
[facility] [address], [address] [place], [place] [address] [phone]  Date:  02/24/2018   Name:  Angela Wall   DOB:  06/18/1966   MRN:  [id number]  PCP:  Pearline Cables, MD    Chief Complaint: Edema (c/o bilateral swelling in legs and ankles. Also c/o SOB and left shoulder pain. )   History of Present Illness:  Angela Wall is a 52 y.o. very pleasant female patient who presents with the following:  History of cervical stenosis, HTN, GERD, LE edema  I last saw her in February  She takes lasix daily for LE edema  She had called yesterday with complaint of worsening swelling and CP, SOB at night.  She was advised to go to the ED.  She went but then left without being seen   Pt notes that her swelling got worse Sunday/ Monday- today is Thursday.  She notes that she had slight chest pain yesterday and today.  Also today she has noted that her left am was hurting her. She does not have any CP or arm pain at this moment however  She also has noted SOB when she was laying flat the last 2 nights.  She feels better when she is upright.    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 She wonders if this is why she has these sx She notes that her job is stressful and that she is having foot pain- left more than right.  Wonders if she might have plantar fascitis   S/p ablation, does not get menses any longer  BP Readings from Last 3 Encounters:  02/24/18 112/82  12/06/17 130/88  05/26/17 124/82   Wt Readings from Last 3 Encounters:  02/24/18 165 lb 12.8 oz (75.2 kg)  12/06/17 161  lb 12.8 oz (73.4 kg)  05/26/17 157 lb 12.8 oz (71.6 kg)     Patient Active Problem List   Diagnosis Date Noted  . Abnormal bruising 12/09/2015  . Pain in joint involving right ankle and foot 11/06/2015  . Breast pain, left 05/23/2015  . Edema 05/23/2015  . Pain in joint, shoulder region 06/05/2014  . Knee pain, left 06/05/2014  . Insomnia 06/05/2014  . Other abnormal glucose 03/14/2012  . Pure hypercholesterolemia 03/14/2012  . SPINAL STENOSIS, CERVICAL 03/27/2010  . Cervical spondylosis with myelopathy 03/19/2010  . LACTOSE INTOLERANCE 12/31/2009  . GERD 07/08/2009  . Essential hypertension 07/05/2009    Past Medical History:  Diagnosis Date  . GERD (gastroesophageal reflux disease)   . Hypertension     Past Surgical History:  Procedure Laterality Date  . BTL post-bilateral ectopics    . ENDOMETRIAL ABLATION     ? Novasure  . TUBAL LIGATION      Social History   Tobacco Use  . Smoking status: Never Smoker  . Smokeless tobacco: Never Used  Substance Use Topics  . Alcohol use: No  . Drug use: No    Family History  Problem Relation Age of  Onset  . Cancer Mother        Breast cancer <50  . Kidney disease Maternal Uncle   . Hypertension Other     Allergies  Allergen Reactions  . Oxycodone-Acetaminophen     REACTION: Itching  . Propoxyphene N-Acetaminophen     REACTION: Itching    Medication list has been reviewed and updated.  Current Outpatient Medications on File Prior to Visit  Medication Sig Dispense Refill  . amLODipine (NORVASC) 5 MG tablet TAKE 1 TABLET (5 MG TOTAL) BY MOUTH DAILY. 90 tablet 1  . furosemide (LASIX) 20 MG tablet Take 2 tablets (40 mg total) by mouth daily. 180 tablet 3  . losartan (COZAAR) 100 MG tablet TAKE 1 TABLET BY MOUTH EVERY DAY 30 tablet 3  . oseltamivir (TAMIFLU) 75 MG capsule Take 1 capsule (75 mg total) by mouth 2 (two) times daily. 10 capsule 0  . ranitidine (ZANTAC) 150 MG capsule Take 1 capsule (150 mg total) by  mouth daily as needed for heartburn. 90 capsule 3   No current facility-administered medications on file prior to visit.     Review of Systems:  As per HPI- otherwise negative. No fever or chills No rash Her chronic edema seems a bit worse than her baseline right now    Physical Examination: Vitals:   02/24/18 1333  BP: 112/82  Pulse: 94  Temp: 98.3 F (36.8 C)  SpO2: 96%   Vitals:   02/24/18 1333  Weight: 165 lb 12.8 oz (75.2 kg)  Height: 4' 11.5" (1.511 m)   Body mass index is 32.93 kg/m. Ideal Body Weight: Weight in (lb) to have BMI = 25: 125.6  GEN: WDWN, NAD, Non-toxic, A & O x 3, mild obesity, looks well  HEENT: Atraumatic, Normocephalic. Neck supple. No masses, No LAD.  Bilateral TM wnl, oropharynx normal.  PEERL,EOMI.   Ears and Nose: No external deformity. CV: RRR, No M/G/R. No JVD. No thrill. No extra heart sounds. PULM: CTA B, no wheezes, crackles, rhonchi. No retractions. No resp. distress. No accessory muscle use. ABD: S, NT, ND. No rebound. No HSM. EXTR: No c/c. She does have edema of both calves and ankles, but feet are spared.  Normal pedal pulses bilaterally  NEURO Normal gait.  PSYCH: Normally interactive. Conversant. Not depressed or anxious appearing.  Calm demeanor.  Left foot is tender at the plantar aspect at the attachment of the plantar fascia  EKG: NSR, compared with EKG from last fall and they are virtually identical Assessment and Plan: Shortness of breath - Plan: EKG 12-Lead, Ambulatory referral to Cardiology  Chest pain, unspecified type - Plan: EKG 12-Lead, Ambulatory referral to Cardiology  Left foot pain - Plan: Ambulatory referral to Sports Medicine  Localized swelling of lower extremity  Here today with complaint of SOB, worsening baseline pedal edema, and CP for the last 2 days CP is not active at this moment EKG is non- acute She had a very similar visit with me last year- BNP was normal at that time  However her complaints  of CP earlier today combined with SOB indicate that she should be seen in the ER.   Pt is in agreement- she will proceed to the ER now Will refer to cardiology now for follow-up, and also to sports med for foot and ankle pain.     Signed Abbe Amsterdam, MD

## 2018-02-24 NOTE — ED Provider Notes (Signed)
[facility] EMERGENCY DEPARTMENT Provider Note   CSN: [id number] Arrival date & time: 02/24/18  1418     History   Chief Complaint Chief Complaint  Patient presents with  . Chest Pain    HPI Angela Wall is a 52 y.o. female.  Patient with history of hypertension on calcium channel blocker --to the emergency department with complaint of bilateral leg swelling, chest pain, intermittent shortness of breath over the past several days.  She was seen at her primary care and sent to the emergency department for further testing.  Chest pain is in the mid chest, under the left breast, into the left shoulder.  Pain is worse with palpation and movement of the shoulder.  Patient typically has mild bilateral lower extremity swelling at baseline, however this is worse in her calfs and into her ankles with some tenderness which is unusual for her.  She reports episodes of shortness of breath that do not occur with exertion each of the past 2 nights.  Patient states that she wakes up short of breath and symptoms will gradually improve. Pt is on chronic lasix for LE edema -- no HF history.      Past Medical History:  Diagnosis Date  . GERD (gastroesophageal reflux disease)   . Hypertension     Patient Active Problem List   Diagnosis Date Noted  . Abnormal bruising 12/09/2015  . Pain in joint involving right ankle and foot 11/06/2015  . Breast pain, left 05/23/2015  . Edema 05/23/2015  . Pain in joint, shoulder region 06/05/2014  . Knee pain, left 06/05/2014  . Insomnia 06/05/2014  . Other abnormal glucose 03/14/2012  . Pure hypercholesterolemia 03/14/2012  . SPINAL STENOSIS, CERVICAL 03/27/2010  . Cervical spondylosis with myelopathy 03/19/2010  . LACTOSE INTOLERANCE 12/31/2009  . GERD 07/08/2009  . Essential hypertension 07/05/2009    Past Surgical History:  Procedure Laterality Date  . BTL post-bilateral ectopics    . ENDOMETRIAL ABLATION     ? Novasure  . TUBAL  LIGATION       OB History   None      Home Medications    Prior to Admission medications   Medication Sig Start Date End Date Taking? Authorizing Provider  amLODipine (NORVASC) 5 MG tablet TAKE 1 TABLET (5 MG TOTAL) BY MOUTH DAILY. 01/10/18   Copland, Gwenlyn Found, MD  furosemide (LASIX) 20 MG tablet Take 2 tablets (40 mg total) by mouth daily. 12/06/17   Copland, Gwenlyn Found, MD  losartan (COZAAR) 100 MG tablet TAKE 1 TABLET BY MOUTH EVERY DAY 03/18/17   Copland, Gwenlyn Found, MD  oseltamivir (TAMIFLU) 75 MG capsule Take 1 capsule (75 mg total) by mouth 2 (two) times daily. 12/06/17   Copland, Gwenlyn Found, MD  ranitidine (ZANTAC) 150 MG capsule Take 1 capsule (150 mg total) by mouth daily as needed for heartburn. 01/20/17   Copland, Gwenlyn Found, MD    Family History Family History  Problem Relation Age of Onset  . Cancer Mother        Breast cancer <50  . Kidney disease Maternal Uncle   . Hypertension Other     Social History Social History   Tobacco Use  . Smoking status: Never Smoker  . Smokeless tobacco: Never Used  Substance Use Topics  . Alcohol use: No  . Drug use: No     Allergies   Oxycodone-acetaminophen and Propoxyphene n-acetaminophen   Review of Systems Review of Systems  Constitutional: Negative for diaphoresis  and fever.  Eyes: Negative for redness.  Respiratory: Positive for shortness of breath. Negative for cough.   Cardiovascular: Positive for chest pain and leg swelling. Negative for palpitations.  Gastrointestinal: Negative for abdominal pain, nausea and vomiting.  Genitourinary: Negative for dysuria.  Musculoskeletal: Negative for back pain and neck pain.  Skin: Negative for rash.  Neurological: Negative for syncope and light-headedness.  Psychiatric/Behavioral: The patient is not nervous/anxious.      Physical Exam Updated Vital Signs BP 135/84   Pulse 76   Temp 98.4 F (36.9 C) (Oral)   Resp 18   Ht 4' 11.5" (1.511 m)   Wt 74.8 kg (165 lb)    SpO2 100%   BMI 32.77 kg/m   Physical Exam  Constitutional: She appears well-developed and well-nourished.  HENT:  Head: Normocephalic and atraumatic.  Mouth/Throat: Mucous membranes are normal. Mucous membranes are not dry.  Eyes: Conjunctivae are normal. Right eye exhibits no discharge. Left eye exhibits no discharge.  Neck: Trachea normal and normal range of motion. Neck supple. Normal carotid pulses and no JVD present. No muscular tenderness present. Carotid bruit is not present. No tracheal deviation present.  Cardiovascular: Normal rate, regular rhythm, S1 normal, S2 normal, normal heart sounds and intact distal pulses. Exam reveals no decreased pulses.  No murmur heard. Pulmonary/Chest: Effort normal and breath sounds normal. No respiratory distress. She has no wheezes. She exhibits no tenderness.  Abdominal: Soft. Normal aorta and bowel sounds are normal. There is no tenderness. There is no rebound and no guarding.  Musculoskeletal: Normal range of motion.       Right lower leg: She exhibits tenderness (mild) and edema (1+).       Left lower leg: She exhibits tenderness (mild) and edema (1+).  Neurological: She is alert.  Skin: Skin is warm and dry. She is not diaphoretic. No cyanosis. No pallor.  Psychiatric: She has a normal mood and affect.  Nursing note and vitals reviewed.    ED Treatments / Results  Labs (all labs ordered are listed, but only abnormal results are displayed) Labs Reviewed  BASIC METABOLIC PANEL  CBC  TROPONIN I  PREGNANCY, URINE  D-DIMER, QUANTITATIVE (NOT AT [facility])    EKG EKG Interpretation  Date/Time:  Thursday Feb 24 2018 14:28:32 EDT Ventricular Rate:  76 PR Interval:  124 QRS Duration: 88 QT Interval:  406 QTC Calculation: 456 R Axis:   57 Text Interpretation:  Normal sinus rhythm Normal ECG When compared to prior, no significant changes seen.  No STEMI Confirmed by Theda Belfast ([id number]) on 02/24/2018 6:00:48 PM   Radiology Dg Chest 2  View  Result Date: 02/24/2018 CLINICAL DATA:  Chest pain EXAM: CHEST - 2 VIEW COMPARISON:  December 06, 2017 FINDINGS: There is no edema or consolidation. Heart size and pulmonary vascularity are normal. No adenopathy. No bone lesions. No pneumothorax. IMPRESSION: No edema or consolidation. Electronically Signed   By: Bretta Bang III M.D.   On: 02/24/2018 14:50    Procedures Procedures (including critical care time)  Medications Ordered in ED Medications  gi cocktail (Maalox,Lidocaine,Donnatal) (30 mLs Oral Given 02/24/18 1846)     Initial Impression / Assessment and Plan / ED Course  I have reviewed the triage vital signs and the nursing notes.  Pertinent labs & imaging results that were available during my care of the patient were reviewed by me and considered in my medical decision making (see chart for details).     Patient seen and examined.  Reviewed PCP office note.  Chest pain seems positional and reproducible.  Troponin is negative.  EKG is normal.  Low risk heart score.  No further work-up here.  Patient has been referred to cardiology by PCP, agree with plan.    Vital signs reviewed and are as follows: BP 135/84   Pulse 76   Temp 98.4 F (36.9 C) (Oral)   Resp 18   Ht 4' 11.5" (1.511 m)   Wt 74.8 kg (165 lb)   SpO2 100%   BMI 32.77 kg/m   Leg swelling without signs of cellulitis.  No evidence of congestive heart failure on chest x-ray.  Shortness of breath not typical for heart failure.  Will ensure no blood clot given swelling with tenderness, although bilateral DVT would be unusual.  Patient does not have a history of liver disease.  Normal kidney function.  Swelling pattern is not suggestive of nephrotic syndrome.  Shortness of breath occurring at night.  This is not persistent.  Will ambulate patient to make sure she does not become hypoxic, although she does not report exertional symptoms.  D-dimer is negative.  Do not suspect PE.  When work-up complete,  anticipate discharge to home if reassuring.  6:49 PM Patient seen ambulating without difficulty. [place] neg. Pt updated.   Encourage patient to return with worsening or persistent chest pain, worsening shortness of breath, new symptoms or other concerns.  Encourage follow-up with PCP and cardiologist as planned.  Final Clinical Impressions(s) / ED Diagnoses   Final diagnoses:  Leg swelling  Shortness of breath  Chest wall pain   Chest pain: Reproducible on palpation and with movement of the left arm.  Troponin negative x1.  EKG is normal and nonischemic.  Chest x-ray is clear.  Shortness of breath: Patient ambulated without difficulty.  D-dimer negative.  No signs of heart failure.  Leg swelling: No evidence of heart failure, history of liver problems, evidence of kidney failure.  No signs of DVT.  ED Discharge Orders    None       Renne Crigler, [place] 02/24/18 1851    Tegeler, Canary Brim, MD 02/25/18 0230

## 2018-02-24 NOTE — Discharge Instructions (Signed)
Please read and follow all provided instructions.  Your diagnoses today include:  1. Leg swelling   2. Shortness of breath   3. Chest wall pain     Tests performed today include:  An EKG of your heart  A chest x-ray  Cardiac enzymes - a blood test for heart muscle damage  Blood counts and electrolytes  Ultrasound of your legs - no signs of blood clots or other problems  Vital signs. See below for your results today.   Medications prescribed:   None  Take any prescribed medications only as directed.  Follow-up instructions: Please follow-up with your primary care provider as planned and cardiology as planned.   Return instructions:  SEEK IMMEDIATE MEDICAL ATTENTION IF:  You have severe chest pain, especially if the pain is crushing or pressure-like and spreads to the arms, back, neck, or jaw, or if you have sweating, nausea (feeling sick to your stomach), or shortness of breath. THIS IS AN EMERGENCY. Don't wait to see if the pain will go away. Get medical help at once. Call 911 or 0 (operator). DO NOT drive yourself to the hospital.   Your chest pain gets worse and does not go away with rest.   You have an attack of chest pain lasting longer than usual, despite rest and treatment with the medications your caregiver has prescribed.   You wake from sleep with chest pain or shortness of breath.  You feel dizzy or faint.  You have chest pain not typical of your usual pain for which you originally saw your caregiver.   You have any other emergent concerns regarding your health.  Additional Information: Chest pain comes from many different causes. Your caregiver has diagnosed you as having chest pain that is not specific for one problem, but does not require admission.  You are at low risk for an acute heart condition or other serious illness.   Your vital signs today were: BP 135/84    Pulse 76    Temp 98.4 F (36.9 C) (Oral)    Resp 18    Ht 4' 11.5" (1.511 m)    Wt  74.8 kg (165 lb)    SpO2 100%    BMI 32.77 kg/m  If your blood pressure (BP) was elevated above 135/85 this visit, please have this repeated by your doctor within one month. --------------

## 2018-02-24 NOTE — ED Notes (Signed)
Pt. Has small amt of edema noted just below the R knee and in the lower extremities bialt.

## 2018-03-03 ENCOUNTER — Ambulatory Visit: Payer: 59 | Admitting: Family Medicine

## 2018-03-07 DIAGNOSIS — R0602 Shortness of breath: Secondary | ICD-10-CM | POA: Insufficient documentation

## 2018-03-07 NOTE — Progress Notes (Signed)
Cardiology Office Note:    Date:  03/11/2018   ID:  Angela Wall, DOB 1966/04/28, MRN [id number]  PCP:  Pearline Cables, MD  Cardiologist:  Norman Herrlich, MD   Referring MD: Pearline Cables, MD  ASSESSMENT:    1. Edema, lower extremity   2. Essential hypertension   3. SOB (shortness of breath)    PLAN:    In order of problems listed above:  1. She will discontinue her calcium channel blocker as this may be the provocative agent causing unexplained lower extremity edema.  Continue her small dose of loop diuretic and of additional agent as needed ARB would be appropriate. 2. Of asked her to continue sodium restrict monitor home blood pressure and contact me if consistently greater than 150 systolic prior to her office follow-up. 3. Her shortness of breath is cryptic there is no obvious cause by current testing and physical examination.  I have asked her to have an echocardiogram performed in order to evaluate left ventricular systolic function diastolic filling pressures pulmonary artery pressures and right heart function exclude congestive heart failure and pulmonary artery hypertension.  Next appointment   Medication Adjustments/Labs and Tests Ordered: Current medicines are reviewed at length with the patient today.  Concerns regarding medicines are outlined above.  Orders Placed This Encounter  Procedures  . ECHOCARDIOGRAM COMPLETE   No orders of the defined types were placed in this encounter.    Chief Complaint  Patient presents with  . New Patient (Initial Visit)    to evaluate [name] per Dr Patsy Lager  . Shortness of Breath  . Hypertension    History of Present Illness:    Angela Wall is a 52 y.o. female with hypertension who is being seen today for the evaluation of edema and SOB at the request of Copland, Gwenlyn Found, MD.  For the last year she has not felt well she has been taking calcium channel blocker during that time and has developed lower  extremity dependent edema worse during the day somewhat improved with elevation of loop diuretic but not resolved and has been evaluated including a lower extremity venous duplex that was normal.  Associated with this she is also had shortness of breath exertionally and has had episodes of what sounds like orthopnea and nocturnal dyspnea.  She is short of breath with activities like climbing stairs.  There is been no cough or wheezing chest x-ray is normal and shows no history of lung disease.  She has had no palpitation or syncope but has had nonanginal soreness in her chest.  She was referred to me after recent emergency room visit.  Her symptoms have plateaued in the last months have not worsened but have not improved.  She has no family history of cardiomyopathy or pulmonary artery hypertension.  She was seen at [facility] ED 02/24/18:  Initial Impression / Assessment and Plan / ED Course  I have reviewed the triage vital signs and the nursing notes. Pertinent labs & imaging results that were available during my care of the patient were reviewed by me and considered in my medical decision making (see chart for details). Patient seen and examined.  Reviewed PCP office note. Chest pain seems positional and reproducible.  Troponin is negative.  EKG is normal.  Low risk heart score.  No further work-up here.  Patient has been referred to cardiology by PCP, agree with plan.    Vital signs reviewed and are as follows: BP 135/84   Pulse  76   Temp 98.4 F (36.9 C) (Oral)   Resp 18   Ht 4' 11.5" (1.511 m)   Wt 74.8 kg (165 lb)   SpO2 100%   BMI 32.77 kg/m   Leg swelling without signs of cellulitis.  No evidence of congestive heart failure on chest x-ray.  Shortness of breath not typical for heart failure.  Will ensure no blood clot given swelling with tenderness, although bilateral DVT would be unusual.  Patient does not have a history of liver disease.  Normal kidney function.  Swelling pattern is not  suggestive of nephrotic syndrome. Shortness of breath occurring at night.  This is not persistent.  Will ambulate patient to make sure she does not become hypoxic, although she does not report exertional symptoms.  D-dimer is negative.  Do not suspect PE. When work-up complete, anticipate discharge to home if reassuring. 6:49 PM Patient seen ambulating without difficulty. [place] neg. Pt updated.  Encourage patient to return with worsening or persistent chest pain, worsening shortness of breath, new symptoms or other concerns. Encourage follow-up with PCP and cardiologist as planned.  Final Clinical Impressions(s) / ED Diagnoses   Final diagnoses:  Leg swelling  Shortness of breath  Chest wall pain   Chest pain: Reproducible on palpation and with movement of the left arm.  Troponin negative x1.  EKG is normal and nonischemic. Chest x-ray is clear. Shortness of breath: Patient ambulated without difficulty.  D-dimer negative.  No signs of heart failure. Leg swelling: No evidence of heart failure, history of liver problems, evidence of kidney failure.  No signs of DVT.   Past Medical History:  Diagnosis Date  . Abnormal bruising 12/09/2015  . Breast pain, left 05/23/2015  . Cervical spondylosis with myelopathy 03/19/2010   Qualifier: Diagnosis of  By: Yetta Barre MD, Bernadene Bell.   . Edema 05/23/2015  . Essential hypertension 07/05/2009   Qualifier: Diagnosis of  By: Yetta Barre MD, Bernadene Bell.   Marland Kitchen GERD 07/08/2009   Qualifier: Diagnosis of  By: Yetta Barre MD, Bernadene Bell.   Marland Kitchen GERD (gastroesophageal reflux disease)   . Hypertension   . Insomnia 06/05/2014  . Knee pain, left 06/05/2014  . LACTOSE INTOLERANCE 12/31/2009   Qualifier: Diagnosis of  By: Jarold Motto MD Lang Snow   . Other abnormal glucose 03/14/2012  . Pain in joint involving right ankle and foot 11/06/2015  . Pain in joint, shoulder region 06/05/2014  . Pure hypercholesterolemia 03/14/2012  . SPINAL STENOSIS, CERVICAL 03/27/2010   Qualifier: Diagnosis of  By:  Yetta Barre MD, Bernadene Bell.     Past Surgical History:  Procedure Laterality Date  . BTL post-bilateral ectopics    . ENDOMETRIAL ABLATION     ? Novasure  . TUBAL LIGATION      Current Medications: Current Meds  Medication Sig  . furosemide (LASIX) 20 MG tablet Take 2 tablets (40 mg total) by mouth daily.  Marland Kitchen losartan (COZAAR) 100 MG tablet TAKE 1 TABLET BY MOUTH EVERY DAY  . ranitidine (ZANTAC) 150 MG capsule Take 1 capsule (150 mg total) by mouth daily as needed for heartburn.  . [DISCONTINUED] amLODipine (NORVASC) 5 MG tablet TAKE 1 TABLET (5 MG TOTAL) BY MOUTH DAILY.     Allergies:   Oxycodone-acetaminophen and Propoxyphene n-acetaminophen   Social History   Socioeconomic History  . Marital status: Divorced    Spouse name: Not on file  . Number of children: Not on file  . Years of education: Not on file  . Highest  education level: Not on file  Occupational History  . Not on file  Social Needs  . Financial resource strain: Not on file  . Food insecurity:    Worry: Not on file    Inability: Not on file  . Transportation needs:    Medical: Not on file    Non-medical: Not on file  Tobacco Use  . Smoking status: Never Smoker  . Smokeless tobacco: Never Used  Substance and Sexual Activity  . Alcohol use: Yes    Comment: social drinker  . Drug use: No  . Sexual activity: Yes    Birth control/protection: Surgical  Lifestyle  . Physical activity:    Days per week: Not on file    Minutes per session: Not on file  . Stress: Not on file  Relationships  . Social connections:    Talks on phone: Not on file    Gets together: Not on file    Attends religious service: Not on file    Active member of club or organization: Not on file    Attends meetings of clubs or organizations: Not on file    Relationship status: Not on file  Other Topics Concern  . Not on file  Social History Narrative  . Not on file     Family History: The patient's family history includes Cancer in  her mother; Heart attack in her paternal grandmother; Hypertension in her other; Kidney disease in her maternal uncle; Stroke in her maternal uncle.  ROS:   Review of Systems  Constitution: Negative.  HENT: Negative.   Eyes: Negative.   Cardiovascular: Positive for chest pain, leg swelling, orthopnea and palpitations.  Respiratory: Positive for cough and shortness of breath. Negative for wheezing.   Endocrine: Negative.   Hematologic/Lymphatic: Negative.   Skin: Negative.   Musculoskeletal: Negative.   Gastrointestinal: Negative.   Genitourinary: Negative.   Neurological: Positive for dizziness.  Psychiatric/Behavioral: Negative.   Allergic/Immunologic: Negative.    Please see the history of present illness.     All other systems reviewed and are negative.  EKGs/Labs/Other Studies Reviewed:    The following studies were reviewed today: I reviewed her emergency room records labs venous duplex EKG prior to the visit  EKG 02/24/18: Date/Time:                  Thursday Feb 24 2018 14:28:32 EDT Ventricular Rate:         76 PR Interval:                 124 QRS Duration: 88 QT Interval:                 406 QTC Calculation:        456 R Axis:                         57 Text Interpretation:       Normal sinus rhythm Normal ECG   CXR: IMPRESSION: No edema or consolidation.  EKG: recently [facility] normal  Lower extremity bilateral venous duplex: IMPRESSION: No evidence of deep venous thrombosis. Recent Labs:   Ref Range & Units 81mo ago 22yr ago 46yr ago  TSH 0.35 - 4.50 uIU/mL 1.42  1.43  0.82 R    05/26/2017: ALT 10; Pro B Natriuretic peptide (BNP) 32.0; TSH 1.42 02/24/2018: BUN 12; Creatinine, Ser 0.73; Hemoglobin 13.1; Platelets 277; Potassium 4.2; Sodium 135  Recent Lipid Panel    Component Value  Date/Time   CHOL 145 01/20/2017 1309   TRIG 54.0 01/20/2017 1309   HDL 54.50 01/20/2017 1309   CHOLHDL 3 01/20/2017 1309   VLDL 10.8 01/20/2017 1309   LDLCALC 80 01/20/2017 1309     Physical Exam:    VS:  BP 114/82 (BP Location: Left Arm, Patient Position: Sitting, Cuff Size: Normal)   Pulse 100   Ht 4' 11.5" (1.511 m)   Wt 163 lb 12.8 oz (74.3 kg)   SpO2 99%   BMI 32.53 kg/m     Wt Readings from Last 3 Encounters:  03/11/18 163 lb 12.8 oz (74.3 kg)  02/24/18 165 lb (74.8 kg)  02/24/18 165 lb 12.8 oz (75.2 kg)     GEN:  Well nourished, well developed in no acute distress HEENT: Normal NECK: No JVD; No carotid bruits LYMPHATICS: No lymphadenopathy CARDIAC:  P2 is normal RRR, no murmurs, rubs, gallops RESPIRATORY:  Clear to auscultation without rales, wheezing or rhonchi  ABDOMEN: Soft, non-tender, non-distended MUSCULOSKELETAL:  2-3+ edema pitting bilateral to the knees and superficial varicosities; No deformity  SKIN: Warm and dry NEUROLOGIC:  Alert and oriented x 3 PSYCHIATRIC:  Normal affect     Signed, Norman Herrlich, MD  03/11/2018 5:10 PM    [facility]

## 2018-03-11 ENCOUNTER — Encounter: Payer: Self-pay | Admitting: Cardiology

## 2018-03-11 ENCOUNTER — Ambulatory Visit: Payer: 59 | Admitting: Cardiology

## 2018-03-11 VITALS — BP 114/82 | HR 100 | Ht 59.5 in | Wt 163.8 lb

## 2018-03-11 DIAGNOSIS — I10 Essential (primary) hypertension: Secondary | ICD-10-CM

## 2018-03-11 DIAGNOSIS — R600 Localized edema: Secondary | ICD-10-CM

## 2018-03-11 DIAGNOSIS — R0602 Shortness of breath: Secondary | ICD-10-CM

## 2018-03-11 NOTE — Patient Instructions (Addendum)
Medication Instructions:  Your physician has recommended you make the following change in your medication:  STOP amlodipine  Labwork: None  Testing/Procedures: Your physician has requested that you have an echocardiogram. Echocardiography is a painless test that uses sound waves to create images of your heart. It provides your doctor with information about the size and shape of your heart and how well your heart's chambers and valves are working. This procedure takes approximately one hour. There are no restrictions for this procedure.  Follow-Up: Your physician recommends that you schedule a follow-up appointment in: 4 weeks.  Any Other Special Instructions Will Be Listed Below (If Applicable).     If you need a refill on your cardiac medications before your next appointment, please call your pharmacy.    Locations to Purchase Compression Stockings:  [facility] [address] [place], [place] [address] [phone] (fax) [email or web address] *Mon-Fri 9am-4:30pm* *Closed on Holidays*  Putting on Compression Stockings Turn the stocking inside-out, then fit it over your toes and heel.  Roll the stocking up your leg.  Once stockings are on, make sure the top of the stocking is about two fingers' width below the crease of the knee (or the groin if you wear thigh-high stockings).  Use equipment, such as a stocking donner, or wear rubber gloves to make it easier to put on compression stockings.   Elastic compression stockings are prescribed to treat many vein problems. Wearing them may be the most important thing you do to manage your symptoms. The stockings fit tightly aroundyour ankle, gradually reducing in pressure as they go up your legs. This helps keep blood flowing toyour heart. As a result, swelling is reduced. Your doctor will prescribe stockings at a safe pressure for you. He or she will also tell you how often to wear and remove the  stockings. Follow these instructions closely.Also, do not buy or wear compression stockings without first seeing your doctor. Tips for Wear and Care To wear stockings safely and to get the most benefit:  Wear the length prescribed by your doctor.  Pull them to the designated height and no farther. Don't let them bunch at the top. This can restrict blood flow and increase swelling.  Wear the stockingsfor the amount of time your doctor recommends. Replace them when they start to feel loose. This will likely be every 3 to 6 months.  Remove them as your doctor directs. When removed, wash your legs. Then check your legs and feet for sores. Call your doctor if you find a sore. Don't put the stockings back on unless your doctor directs.  Wash the stockings as instructed. They may need to be handwashed.

## 2018-03-14 ENCOUNTER — Telehealth: Payer: Self-pay | Admitting: Family Medicine

## 2018-03-14 NOTE — Telephone Encounter (Signed)
Copied from CRM [id number]. Topic: Quick Communication - Rx Refill/Question >> Mar 14, 2018  6:27 PM Alexander Bergeron B wrote: Medication: amlodipine  Pt called to let pcp know that the cardiologist took her off the medication above, call pt to advise if needed

## 2018-03-15 NOTE — Telephone Encounter (Signed)
Attempted to call patient and see how she was doing off her amlodipine. Left a message to call us back. She was taken off the amlodipine on May 17 th by her cardiologist.

## 2018-03-18 ENCOUNTER — Ambulatory Visit (HOSPITAL_BASED_OUTPATIENT_CLINIC_OR_DEPARTMENT_OTHER)
Admission: RE | Admit: 2018-03-18 | Discharge: 2018-03-18 | Disposition: A | Discharge status: Home / Self Care | Payer: 59 | Source: Ambulatory Visit | Attending: Cardiology | Admitting: Cardiology

## 2018-03-18 DIAGNOSIS — R600 Localized edema: Secondary | ICD-10-CM | POA: Diagnosis not present

## 2018-03-18 DIAGNOSIS — I340 Nonrheumatic mitral (valve) insufficiency: Secondary | ICD-10-CM | POA: Insufficient documentation

## 2018-03-18 DIAGNOSIS — R0602 Shortness of breath: Secondary | ICD-10-CM | POA: Diagnosis not present

## 2018-03-18 DIAGNOSIS — R0600 Dyspnea, unspecified: Secondary | ICD-10-CM | POA: Diagnosis present

## 2018-03-18 NOTE — Progress Notes (Signed)
Echocardiogram 2D Echocardiogram has been performed.  Angela Wall 03/18/2018, 10:06 AM

## 2018-04-07 NOTE — Progress Notes (Deleted)
Cardiology Office Note:    Date:  04/07/2018   ID:  Angela Wall, DOB 1966-10-21, MRN [id number]  PCP:  Pearline Cables, MD  Cardiologist:  Norman Herrlich, MD    Referring MD: Pearline Cables, MD    ASSESSMENT:    No diagnosis found. PLAN:    In order of problems listed above:  1. ***   Next appointment: ***   Medication Adjustments/Labs and Tests Ordered: Current medicines are reviewed at length with the patient today.  Concerns regarding medicines are outlined above.  No orders of the defined types were placed in this encounter.  No orders of the defined types were placed in this encounter.   No chief complaint on file.   History of Present Illness:    Angela Wall is a 52 y.o. female with a hx of hypertension, edema and SOB last seen 03/11/18.  ASSESSMENT:    03/11/18   1. Edema, lower extremity   2. Essential hypertension   3. SOB (shortness of breath)    PLAN:    1. She will discontinue her calcium channel blocker as this may be the provocative agent causing unexplained lower extremity edema.  Continue her small dose of loop diuretic and of additional agent as needed ARB would be appropriate. 2. Of asked her to continue sodium restrict monitor home blood pressure and contact me if consistently greater than 150 systolic prior to her office follow-up. 3. Her shortness of breath is cryptic there is no obvious cause by current testing and physical examination.  I have asked her to have an echocardiogram performed in order to evaluate left ventricular systolic function diastolic filling pressures pulmonary artery pressures and right heart function exclude congestive heart failure and pulmonary artery hypertension.    Compliance with diet, lifestyle and medications: *** Past Medical History:  Diagnosis Date  . Abnormal bruising 12/09/2015  . Breast pain, left 05/23/2015  . Cervical spondylosis with myelopathy 03/19/2010   Qualifier: Diagnosis of   By: Yetta Barre MD, Bernadene Bell.   . Edema 05/23/2015  . Essential hypertension 07/05/2009   Qualifier: Diagnosis of  By: Yetta Barre MD, Bernadene Bell.   Marland Kitchen GERD 07/08/2009   Qualifier: Diagnosis of  By: Yetta Barre MD, Bernadene Bell.   Marland Kitchen GERD (gastroesophageal reflux disease)   . Hypertension   . Insomnia 06/05/2014  . Knee pain, left 06/05/2014  . LACTOSE INTOLERANCE 12/31/2009   Qualifier: Diagnosis of  By: Jarold Motto MD Lang Snow   . Other abnormal glucose 03/14/2012  . Pain in joint involving right ankle and foot 11/06/2015  . Pain in joint, shoulder region 06/05/2014  . Pure hypercholesterolemia 03/14/2012  . SPINAL STENOSIS, CERVICAL 03/27/2010   Qualifier: Diagnosis of  By: Yetta Barre MD, Bernadene Bell.     Past Surgical History:  Procedure Laterality Date  . BTL post-bilateral ectopics    . ENDOMETRIAL ABLATION     ? Novasure  . TUBAL LIGATION      Current Medications: No outpatient medications have been marked as taking for the 04/08/18 encounter (Appointment) with Baldo Daub, MD.     Allergies:   Oxycodone-acetaminophen and Propoxyphene n-acetaminophen   Social History   Socioeconomic History  . Marital status: Divorced    Spouse name: Not on file  . Number of children: Not on file  . Years of education: Not on file  . Highest education level: Not on file  Occupational History  . Not on file  Social Needs  . Physicist, medical  strain: Not on file  . Food insecurity:    Worry: Not on file    Inability: Not on file  . Transportation needs:    Medical: Not on file    Non-medical: Not on file  Tobacco Use  . Smoking status: Never Smoker  . Smokeless tobacco: Never Used  Substance and Sexual Activity  . Alcohol use: Yes    Comment: social drinker  . Drug use: No  . Sexual activity: Yes    Birth control/protection: Surgical  Lifestyle  . Physical activity:    Days per week: Not on file    Minutes per session: Not on file  . Stress: Not on file  Relationships  . Social connections:    Talks  on phone: Not on file    Gets together: Not on file    Attends religious service: Not on file    Active member of club or organization: Not on file    Attends meetings of clubs or organizations: Not on file    Relationship status: Not on file  Other Topics Concern  . Not on file  Social History Narrative  . Not on file     Family History: The patient's ***family history includes Cancer in her mother; Heart attack in her paternal grandmother; Hypertension in her other; Kidney disease in her maternal uncle; Stroke in her maternal uncle. ROS:   Please see the history of present illness.    All other systems reviewed and are negative.  EKGs/Labs/Other Studies Reviewed:    The following studies were reviewed today:  EKG:  EKG ordered today.  The ekg ordered today demonstrates *** Echo 03/18/18: Study Conclusions - Left ventricle: The cavity size was normal. Systolic function was   normal. The estimated ejection fraction was in the range of 55%   to 60%. Wall motion was normal; there were no regional wall   motion abnormalities. - Mitral valve: There was mild regurgitation. Impressions: - Normal echo. Recent Labs: 05/26/2017: ALT 10; Pro B Natriuretic peptide (BNP) 32.0; TSH 1.42 02/24/2018: BUN 12; Creatinine, Ser 0.73; Hemoglobin 13.1; Platelets 277; Potassium 4.2; Sodium 135  Recent Lipid Panel    Component Value Date/Time   CHOL 145 01/20/2017 1309   TRIG 54.0 01/20/2017 1309   HDL 54.50 01/20/2017 1309   CHOLHDL 3 01/20/2017 1309   VLDL 10.8 01/20/2017 1309   LDLCALC 80 01/20/2017 1309    Physical Exam:    VS:  There were no vitals taken for this visit.    Wt Readings from Last 3 Encounters:  03/11/18 163 lb 12.8 oz (74.3 kg)  02/24/18 165 lb (74.8 kg)  02/24/18 165 lb 12.8 oz (75.2 kg)     GEN: *** Well nourished, well developed in no acute distress HEENT: Normal NECK: No JVD; No carotid bruits LYMPHATICS: No lymphadenopathy CARDIAC: ***RRR, no murmurs, rubs,  gallops RESPIRATORY:  Clear to auscultation without rales, wheezing or rhonchi  ABDOMEN: Soft, non-tender, non-distended MUSCULOSKELETAL:  No edema; No deformity  SKIN: Warm and dry NEUROLOGIC:  Alert and oriented x 3 PSYCHIATRIC:  Normal affect    Signed, Norman HerrlichBrian Munley, MD  04/07/2018 2:41 PM    [facility]

## 2018-04-08 ENCOUNTER — Ambulatory Visit: Payer: 59 | Admitting: Cardiology

## 2018-04-15 ENCOUNTER — Other Ambulatory Visit: Payer: Self-pay | Admitting: Family Medicine

## 2018-05-24 ENCOUNTER — Ambulatory Visit (INDEPENDENT_AMBULATORY_CARE_PROVIDER_SITE_OTHER): Payer: 59

## 2018-05-24 ENCOUNTER — Encounter: Payer: Self-pay | Admitting: Podiatry

## 2018-05-24 ENCOUNTER — Ambulatory Visit: Payer: 59 | Admitting: Podiatry

## 2018-05-24 DIAGNOSIS — M7661 Achilles tendinitis, right leg: Secondary | ICD-10-CM | POA: Diagnosis not present

## 2018-05-24 DIAGNOSIS — M722 Plantar fascial fibromatosis: Secondary | ICD-10-CM

## 2018-05-24 MED ORDER — PREDNISONE 10 MG PO TABS: ORAL_TABLET | ORAL | 0 refills | Status: DC

## 2018-05-24 NOTE — Progress Notes (Signed)
Subjective:   Patient ID: Angela Wall, female   DOB: 52 y.o.   MRN: [id number]   HPI 52 year old female presents the office today for concerns of bilateral foot pain.  She states that the pain is been ongoing for several months has been getting worse.  She did see her primary care physician but she said no significant treatment.  She states the pain is up to the bottom of her left foot in the back of the right heel.  She states the pain is becoming more consistent during the day on both feet.  She denies any significant numbness or tingling denies any recent injury or trauma or swelling.  She has no other concerns today.   Review of Systems  All other systems reviewed and are negative.  Past Medical History:  Diagnosis Date  . Abnormal bruising 12/09/2015  . Breast pain, left 05/23/2015  . Cervical spondylosis with myelopathy 03/19/2010   Qualifier: Diagnosis of  By: Yetta BarreJones MD, Bernadene Bellhomas L.   . Edema 05/23/2015  . Essential hypertension 07/05/2009   Qualifier: Diagnosis of  By: Yetta BarreJones MD, Bernadene Bellhomas L.   Marland Kitchen. GERD 07/08/2009   Qualifier: Diagnosis of  By: Yetta BarreJones MD, Bernadene Bellhomas L.   Marland Kitchen. GERD (gastroesophageal reflux disease)   . Hypertension   . Insomnia 06/05/2014  . Knee pain, left 06/05/2014  . LACTOSE INTOLERANCE 12/31/2009   Qualifier: Diagnosis of  By: Jarold MottoPatterson MD Lang SnowFACG, David R   . Other abnormal glucose 03/14/2012  . Pain in joint involving right ankle and foot 11/06/2015  . Pain in joint, shoulder region 06/05/2014  . Pure hypercholesterolemia 03/14/2012  . SPINAL STENOSIS, CERVICAL 03/27/2010   Qualifier: Diagnosis of  By: Yetta BarreJones MD, Bernadene Bellhomas L.     Past Surgical History:  Procedure Laterality Date  . BTL post-bilateral ectopics    . ENDOMETRIAL ABLATION     ? Novasure  . TUBAL LIGATION       Current Outpatient Medications:  .  furosemide (LASIX) 20 MG tablet, Take 2 tablets (40 mg total) by mouth daily., Disp: 180 tablet, Rfl: 3 .  losartan (COZAAR) 100 MG tablet, TAKE 1 TABLET EVERY DAY,  Disp: 30 tablet, Rfl: 5 .  predniSONE (DELTASONE) 10 MG tablet, 12 day taper dose, Disp: 48 tablet, Rfl: 0 .  ranitidine (ZANTAC) 150 MG capsule, Take 1 capsule (150 mg total) by mouth daily as needed for heartburn., Disp: 90 capsule, Rfl: 3  Allergies  Allergen Reactions  . Oxycodone-Acetaminophen     REACTION: Itching  . Propoxyphene N-Acetaminophen     REACTION: Itching          Objective:  Physical Exam  General: AAO x3, NAD  Dermatological: Skin is warm, dry and supple bilateral. Nails x 10 are well manicured; remaining integument appears unremarkable at this time. There are no open sores, no preulcerative lesions, no rash or signs of infection present.  Vascular: Dorsalis Pedis artery and Posterior Tibial artery pedal pulses are 2/4 bilateral with immedate capillary fill time. There is no pain with calf compression, swelling, warmth, erythema.   Neruologic: Grossly intact via light touch bilateral.  Protective threshold with Semmes Wienstein monofilament intact to all pedal sites bilateral.  Negative Tinel sign.  Musculoskeletal: On the left foot there is tenderness palpation of the plantar medial tubercle of the calcaneus at the insertion upon the fascia there is mild discomfort on the medial band within the arch of the foot.  Overall the plantar pressure appears to be intact.  There  is no pain with lateral compression of calcaneus there is no pain to the Achilles tendon.  On the right side there is tenderness on the distal portion of the Achilles tendon insertion into the calcaneus.  Thompson test is negative.  Again there is no pain with lateral compression of the calcaneus.  No other areas of tenderness.  Mild swelling to the sinus tarsi bilaterally.  Muscular strength 5/5 in all groups tested bilateral.  Gait: Unassisted, Nonantalgic.     Assessment:   Left foot pain, plantar branches of the right insertional Achilles tendinitis    Plan:  -Treatment options discussed  including all alternatives, risks, and complications -Etiology of symptoms were discussed -X-rays were obtained and reviewed with the patient.  There is no definitive evidence of acute fracture and there is no significant spurring present. -Steroid injection performed the left foot.  See procedure note below. -Prednisone Dosepak was prescribed. -Plantar fascial brace left side -Night splint that she can use bilaterally -Discussed stretching, icing exercises daily. -Discussed shoe modifications and orthotics  Procedure: Injection Tendon/Ligament Discussed alternatives, risks, complications and verbal consent was obtained.  Location: Left plantar fascia at the glabrous junction; medial approach. Skin Prep: Alcohol. Injectate: 0.5cc 0.5% marcaine plain, 0.5 cc 2% lidocaine plain and, 1 cc kenalog 10. Disposition: Patient tolerated procedure well. Injection site dressed with a band-aid.  Post-injection care was discussed and return precautions discussed.    Return in about 3 weeks (around 06/14/2018).  Vivi Barrack DPM

## 2018-05-24 NOTE — Patient Instructions (Signed)
For instructions on how to put on your Night Splint, please visit [email or web address]  For instructions on how to put on your Plantar Fascial Brace, please visit [email or web address]  If was nice to meet you today. If you have any questions or any further concerns, please feel fee to give me a call. You can call our office at [phone] or please feel fee to send me a message through MyChart.   -----   lantar Fasciitis (Heel Spur Syndrome) with Rehab The plantar fascia is a fibrous, ligament-like, soft-tissue structure that spans the bottom of the foot. Plantar fasciitis is a condition that causes pain in the foot due to inflammation of the tissue. SYMPTOMS   Pain and tenderness on the underneath side of the foot.  Pain that worsens with standing or walking. CAUSES  Plantar fasciitis is caused by irritation and injury to the plantar fascia on the underneath side of the foot. Common mechanisms of injury include:  Direct trauma to bottom of the foot.  Damage to a small nerve that runs under the foot where the main fascia attaches to the heel bone.  Stress placed on the plantar fascia due to bone spurs. RISK INCREASES WITH:   Activities that place stress on the plantar fascia (running, jumping, pivoting, or cutting).  Poor strength and flexibility.  Improperly fitted shoes.  Tight calf muscles.  Flat feet.  Failure to warm-up properly before activity.  Obesity. PREVENTION  Warm up and stretch properly before activity.  Allow for adequate recovery between workouts.  Maintain physical fitness:  Strength, flexibility, and endurance.  Cardiovascular fitness.  Maintain a health body weight.  Avoid stress on the plantar fascia.  Wear properly fitted shoes, including arch supports for individuals who have flat feet.  PROGNOSIS  If treated properly, then the symptoms of plantar fasciitis usually resolve without surgery. However, occasionally surgery is  necessary.  RELATED COMPLICATIONS   Recurrent symptoms that may result in a chronic condition.  Problems of the lower back that are caused by compensating for the injury, such as limping.  Pain or weakness of the foot during push-off following surgery.  Chronic inflammation, scarring, and partial or complete fascia tear, occurring more often from repeated injections.  TREATMENT  Treatment initially involves the use of ice and medication to help reduce pain and inflammation. The use of strengthening and stretching exercises may help reduce pain with activity, especially stretches of the Achilles tendon. These exercises may be performed at home or with a therapist. Your caregiver may recommend that you use heel cups of arch supports to help reduce stress on the plantar fascia. Occasionally, corticosteroid injections are given to reduce inflammation. If symptoms persist for greater than 6 months despite non-surgical (conservative), then surgery may be recommended.   MEDICATION   If pain medication is necessary, then nonsteroidal anti-inflammatory medications, such as aspirin and ibuprofen, or other minor pain relievers, such as acetaminophen, are often recommended.  Do not take pain medication within 7 days before surgery.  Prescription pain relievers may be given if deemed necessary by your caregiver. Use only as directed and only as much as you need.  Corticosteroid injections may be given by your caregiver. These injections should be reserved for the most serious cases, because they may only be given a certain number of times.  HEAT AND COLD  Cold treatment (icing) relieves pain and reduces inflammation. Cold treatment should be applied for 10 to 15 minutes every 2 to 3 hours for inflammation and pain and  immediately after any activity that aggravates your symptoms. Use ice packs or massage the area with a piece of ice (ice massage).  Heat treatment may be used prior to performing the  stretching and strengthening activities prescribed by your caregiver, physical therapist, or athletic trainer. Use a heat pack or soak the injury in warm water.  SEEK IMMEDIATE MEDICAL CARE IF:  Treatment seems to offer no benefit, or the condition worsens.  Any medications produce adverse side effects.  EXERCISES- RANGE OF MOTION (ROM) AND STRETCHING EXERCISES - Plantar Fasciitis (Heel Spur Syndrome) These exercises may help you when beginning to rehabilitate your injury. Your symptoms may resolve with or without further involvement from your physician, physical therapist or athletic trainer. While completing these exercises, remember:   Restoring tissue flexibility helps normal motion to return to the joints. This allows healthier, less painful movement and activity.  An effective stretch should be held for at least 30 seconds.  A stretch should never be painful. You should only feel a gentle lengthening or release in the stretched tissue.  RANGE OF MOTION - Toe Extension, Flexion  Sit with your right / left leg crossed over your opposite knee.  Grasp your toes and gently pull them back toward the top of your foot. You should feel a stretch on the bottom of your toes and/or foot.  Hold this stretch for 10 seconds.  Now, gently pull your toes toward the bottom of your foot. You should feel a stretch on the top of your toes and or foot.  Hold this stretch for 10 seconds. Repeat  times. Complete this stretch 3 times per day.   RANGE OF MOTION - Ankle Dorsiflexion, Active Assisted  Remove shoes and sit on a chair that is preferably not on a carpeted surface.  Place right / left foot under knee. Extend your opposite leg for support.  Keeping your heel down, slide your right / left foot back toward the chair until you feel a stretch at your ankle or calf. If you do not feel a stretch, slide your bottom forward to the edge of the chair, while still keeping your heel down.  Hold this  stretch for 10 seconds. Repeat 3 times. Complete this stretch 2 times per day.   STRETCH  Gastroc, Standing  Place hands on wall.  Extend right / left leg, keeping the front knee somewhat bent.  Slightly point your toes inward on your back foot.  Keeping your right / left heel on the floor and your knee straight, shift your weight toward the wall, not allowing your back to arch.  You should feel a gentle stretch in the right / left calf. Hold this position for 10 seconds. Repeat 3 times. Complete this stretch 2 times per day.  STRETCH  Soleus, Standing  Place hands on wall.  Extend right / left leg, keeping the other knee somewhat bent.  Slightly point your toes inward on your back foot.  Keep your right / left heel on the floor, bend your back knee, and slightly shift your weight over the back leg so that you feel a gentle stretch deep in your back calf.  Hold this position for 10 seconds. Repeat 3 times. Complete this stretch 2 times per day.  STRETCH  Gastrocsoleus, Standing  Note: This exercise can place a lot of stress on your foot and ankle. Please complete this exercise only if specifically instructed by your caregiver.   Place the ball of your right / left  foot on a step, keeping your other foot firmly on the same step.  Hold on to the wall or a rail for balance.  Slowly lift your other foot, allowing your body weight to press your heel down over the edge of the step.  You should feel a stretch in your right / left calf.  Hold this position for 10 seconds.  Repeat this exercise with a slight bend in your right / left knee. Repeat 3 times. Complete this stretch 2 times per day.   STRENGTHENING EXERCISES - Plantar Fasciitis (Heel Spur Syndrome)  These exercises may help you when beginning to rehabilitate your injury. They may resolve your symptoms with or without further involvement from your physician, physical therapist or athletic trainer. While completing these  exercises, remember:   Muscles can gain both the endurance and the strength needed for everyday activities through controlled exercises.  Complete these exercises as instructed by your physician, physical therapist or athletic trainer. Progress the resistance and repetitions only as guided.  STRENGTH - Towel Curls  Sit in a chair positioned on a non-carpeted surface.  Place your foot on a towel, keeping your heel on the floor.  Pull the towel toward your heel by only curling your toes. Keep your heel on the floor. Repeat 3 times. Complete this exercise 2 times per day.  STRENGTH - Ankle Inversion  Secure one end of a rubber exercise band/tubing to a fixed object (table, pole). Loop the other end around your foot just before your toes.  Place your fists between your knees. This will focus your strengthening at your ankle.  Slowly, pull your big toe up and in, making sure the band/tubing is positioned to resist the entire motion.  Hold this position for 10 seconds.  Have your muscles resist the band/tubing as it slowly pulls your foot back to the starting position. Repeat 3 times. Complete this exercises 2 times per day.  Document Released: 10/12/2005 Document Revised: 01/04/2012 Document Reviewed: 01/24/2009 [facility] Patient Information 2014 [place], [place].    Achilles Tendinitis  with Rehab Achilles tendinitis is a disorder of the Achilles tendon. The Achilles tendon connects the large calf muscles (Gastrocnemius and Soleus) to the heel bone (calcaneus). This tendon is sometimes called the heel cord. It is important for pushing-off and standing on your toes and is important for walking, running, or jumping. Tendinitis is often caused by overuse and repetitive microtrauma. SYMPTOMS  Pain, tenderness, swelling, warmth, and redness may occur over the Achilles tendon even at rest.  Pain with pushing off, or flexing or extending the ankle.  Pain that is worsened after or during  activity. CAUSES   Overuse sometimes seen with rapid increase in exercise programs or in sports requiring running and jumping.  Poor physical conditioning (strength and flexibility or endurance).  Running sports, especially training running down hills.  Inadequate warm-up before practice or play or failure to stretch before participation.  Injury to the tendon. PREVENTION   Warm up and stretch before practice or competition.  Allow time for adequate rest and recovery between practices and competition.  Keep up conditioning.  Keep up ankle and leg flexibility.  Improve or keep muscle strength and endurance.  Improve cardiovascular fitness.  Use proper technique.  Use proper equipment (shoes, skates).  To help prevent recurrence, taping, protective strapping, or an adhesive bandage may be recommended for several weeks after healing is complete. PROGNOSIS   Recovery may take weeks to several months to heal.  Longer recovery  is expected if symptoms have been prolonged.  Recovery is usually quicker if the inflammation is due to a direct blow as compared with overuse or sudden strain. RELATED COMPLICATIONS   Healing time will be prolonged if the condition is not correctly treated. The injury must be given plenty of time to heal.  Symptoms can reoccur if activity is resumed too soon.  Untreated, tendinitis may increase the risk of tendon rupture requiring additional time for recovery and possibly surgery. TREATMENT   The first treatment consists of rest anti-inflammatory medication, and ice to relieve the pain.  Stretching and strengthening exercises after resolution of pain will likely help reduce the risk of recurrence. Referral to a physical therapist or athletic trainer for further evaluation and treatment may be helpful.  A walking boot or cast may be recommended to rest the Achilles tendon. This can help break the cycle of inflammation and microtrauma.  Arch  supports (orthotics) may be prescribed or recommended by your caregiver as an adjunct to therapy and rest.  Surgery to remove the inflamed tendon lining or degenerated tendon tissue is rarely necessary and has shown less than predictable results. MEDICATION   Nonsteroidal anti-inflammatory medications, such as aspirin and ibuprofen, may be used for pain and inflammation relief. Do not take within 7 days before surgery. Take these as directed by your caregiver. Contact your caregiver immediately if any bleeding, stomach upset, or signs of allergic reaction occur. Other minor pain relievers, such as acetaminophen, may also be used.  Pain relievers may be prescribed as necessary by your caregiver. Do not take prescription pain medication for longer than 4 to 7 days. Use only as directed and only as much as you need.  Cortisone injections are rarely indicated. Cortisone injections may weaken tendons and predispose to rupture. It is better to give the condition more time to heal than to use them. HEAT AND COLD  Cold is used to relieve pain and reduce inflammation for acute and chronic Achilles tendinitis. Cold should be applied for 10 to 15 minutes every 2 to 3 hours for inflammation and pain and immediately after any activity that aggravates your symptoms. Use ice packs or an ice massage.  Heat may be used before performing stretching and strengthening activities prescribed by your caregiver. Use a heat pack or a warm soak. SEEK MEDICAL CARE IF:  Symptoms get worse or do not improve in 2 weeks despite treatment.  New, unexplained symptoms develop. Drugs used in treatment may produce side effects.  EXERCISES:  RANGE OF MOTION (ROM) AND STRETCHING EXERCISES - Achilles Tendinitis  These exercises may help you when beginning to rehabilitate your injury. Your symptoms may resolve with or without further involvement from your physician, physical therapist or athletic trainer. While completing these  exercises, remember:   Restoring tissue flexibility helps normal motion to return to the joints. This allows healthier, less painful movement and activity.  An effective stretch should be held for at least 30 seconds.  A stretch should never be painful. You should only feel a gentle lengthening or release in the stretched tissue.  STRETCH  Gastroc, Standing   Place hands on wall.  Extend right / left leg, keeping the front knee somewhat bent.  Slightly point your toes inward on your back foot.  Keeping your right / left heel on the floor and your knee straight, shift your weight toward the wall, not allowing your back to arch.  You should feel a gentle stretch in the right /  left calf. Hold this position for 10 seconds. Repeat 3 times. Complete this stretch 2 times per day.  STRETCH  Soleus, Standing   Place hands on wall.  Extend right / left leg, keeping the other knee somewhat bent.  Slightly point your toes inward on your back foot.  Keep your right / left heel on the floor, bend your back knee, and slightly shift your weight over the back leg so that you feel a gentle stretch deep in your back calf.  Hold this position for 10 seconds. Repeat 3 times. Complete this stretch 2 times per day.  STRETCH  Gastrocsoleus, Standing  Note: This exercise can place a lot of stress on your foot and ankle. Please complete this exercise only if specifically instructed by your caregiver.   Place the ball of your right / left foot on a step, keeping your other foot firmly on the same step.  Hold on to the wall or a rail for balance.  Slowly lift your other foot, allowing your body weight to press your heel down over the edge of the step.  You should feel a stretch in your right / left calf.  Hold this position for 10 seconds.  Repeat this exercise with a slight bend in your knee. Repeat 3 times. Complete this stretch 2 times per day.   STRENGTHENING EXERCISES - Achilles  Tendinitis These exercises may help you when beginning to rehabilitate your injury. They may resolve your symptoms with or without further involvement from your physician, physical therapist or athletic trainer. While completing these exercises, remember:   Muscles can gain both the endurance and the strength needed for everyday activities through controlled exercises.  Complete these exercises as instructed by your physician, physical therapist or athletic trainer. Progress the resistance and repetitions only as guided.  You may experience muscle soreness or fatigue, but the pain or discomfort you are trying to eliminate should never worsen during these exercises. If this pain does worsen, stop and make certain you are following the directions exactly. If the pain is still present after adjustments, discontinue the exercise until you can discuss the trouble with your clinician.  STRENGTH - Plantar-flexors   Sit with your right / left leg extended. Holding onto both ends of a rubber exercise band/tubing, loop it around the ball of your foot. Keep a slight tension in the band.  Slowly push your toes away from you, pointing them downward.  Hold this position for 10 seconds. Return slowly, controlling the tension in the band/tubing. Repeat 3 times. Complete this exercise 2 times per day.   STRENGTH - Plantar-flexors   Stand with your feet shoulder width apart. Steady yourself with a wall or table using as little support as needed.  Keeping your weight evenly spread over the width of your feet, rise up on your toes.*  Hold this position for 10 seconds. Repeat 3 times. Complete this exercise 2 times per day.  *If this is too easy, shift your weight toward your right / left leg until you feel challenged. Ultimately, you may be asked to do this exercise with your right / left foot only.  STRENGTH  Plantar-flexors, Eccentric  Note: This exercise can place a lot of stress on your foot and ankle.  Please complete this exercise only if specifically instructed by your caregiver.   Place the balls of your feet on a step. With your hands, use only enough support from a wall or rail to keep your balance.  Keep your knees straight and rise up on your toes.  Slowly shift your weight entirely to your right / left toes and pick up your opposite foot. Gently and with controlled movement, lower your weight through your right / left foot so that your heel drops below the level of the step. You will feel a slight stretch in the back of your calf at the end position.  Use the healthy leg to help rise up onto the balls of both feet, then lower weight only on the right / left leg again. Build up to 15 repetitions. Then progress to 3 consecutive sets of 15 repetitions.*  After completing the above exercise, complete the same exercise with a slight knee bend (about 30 degrees). Again, build up to 15 repetitions. Then progress to 3 consecutive sets of 15 repetitions.* Perform this exercise 2 times per day.  *When you easily complete 3 sets of 15, your physician, physical therapist or athletic trainer may advise you to add resistance by wearing a backpack filled with additional weight.  STRENGTH - Plantar Flexors, Seated   Sit on a chair that allows your feet to rest flat on the ground. If necessary, sit at the edge of the chair.  Keeping your toes firmly on the ground, lift your right / left heel as far as you can without increasing any discomfort in your ankle. Repeat 3 times. Complete this exercise 2 times a day.

## 2018-06-01 ENCOUNTER — Telehealth: Payer: Self-pay | Admitting: Podiatry

## 2018-06-01 NOTE — Telephone Encounter (Signed)
I'm a pt of Dr. Gabriel RungWagoner's and the boot I was given is too big. I need to speak to someone about swapping it out. I can be reached at [phone]. Thank you.

## 2018-06-01 NOTE — Telephone Encounter (Signed)
I informed pt she could come by the office today before 5:00pm or tomorrow between 8:00am and 5:00pm and one of the assistants would refit to the proper size boot. Pt states she will come by tomorrow at lunch.

## 2018-06-14 ENCOUNTER — Ambulatory Visit: Payer: 59 | Admitting: Podiatry

## 2018-07-07 ENCOUNTER — Encounter: Payer: Self-pay | Admitting: Family Medicine

## 2018-07-07 ENCOUNTER — Ambulatory Visit: Payer: 59 | Admitting: Family Medicine

## 2018-07-07 ENCOUNTER — Encounter

## 2018-07-07 VITALS — BP 128/80 | HR 89 | Temp 99.1°F | Ht 59.5 in | Wt 163.2 lb

## 2018-07-07 DIAGNOSIS — J0100 Acute maxillary sinusitis, unspecified: Secondary | ICD-10-CM | POA: Diagnosis not present

## 2018-07-07 MED ORDER — AMOXICILLIN-POT CLAVULANATE 875-125 MG PO TABS: 1.0000 | ORAL_TABLET | Freq: Two times a day (BID) | ORAL | 0 refills | Status: DC

## 2018-07-07 NOTE — Progress Notes (Signed)
Pre visit review using our clinic review tool, if applicable. No additional management support is needed unless otherwise documented below in the visit note. 

## 2018-07-07 NOTE — Patient Instructions (Signed)
Continue to push fluids, practice good hand hygiene, and cover your mouth if you cough.  If you start having fevers, shaking or shortness of breath, seek immediate care.  Most sinus infections are viral in etiology and antibiotics will not be helpful. That being said, if you start having worsening symptoms over 3 days, you are worsening by day 10 or not improving by day 14, go ahead and take it. You are on Day 4 as of now.   Let us know if you need anything.  

## 2018-07-07 NOTE — Progress Notes (Signed)
Chief Complaint  Patient presents with  . Sinusitis  . Cough  . Ear Fullness    Angela Wall here for URI complaints.  Duration: 3 days  Associated symptoms: sinus congestion, rhinorrhea, ear fullness, sore throat and myalgia Denies: sinus pain, ear pain, ear drainage, wheezing, shortness of breath and fevers Treatment to date: Nyquil, Dayquil, OTC allergy medicine; helped a little Sick contacts: Yes- woman at work  ROS:  Const: Denies fevers HEENT: As noted in HPI Lungs: No SOB  Past Medical History:  Diagnosis Date  . Abnormal bruising 12/09/2015  . Breast pain, left 05/23/2015  . Cervical spondylosis with myelopathy 03/19/2010   Qualifier: Diagnosis of  By: Yetta BarreJones MD, Bernadene Bellhomas L.   . Edema 05/23/2015  . Essential hypertension 07/05/2009   Qualifier: Diagnosis of  By: Yetta BarreJones MD, Bernadene Bellhomas L.   Marland Kitchen. GERD 07/08/2009   Qualifier: Diagnosis of  By: Yetta BarreJones MD, Bernadene Bellhomas L.   Marland Kitchen. GERD (gastroesophageal reflux disease)   . Hypertension   . Insomnia 06/05/2014  . Knee pain, left 06/05/2014  . LACTOSE INTOLERANCE 12/31/2009   Qualifier: Diagnosis of  By: Jarold MottoPatterson MD Lang SnowFACG, David R   . Other abnormal glucose 03/14/2012  . Pain in joint involving right ankle and foot 11/06/2015  . Pain in joint, shoulder region 06/05/2014  . Pure hypercholesterolemia 03/14/2012  . SPINAL STENOSIS, CERVICAL 03/27/2010   Qualifier: Diagnosis of  By: Yetta BarreJones MD, Maisie Fushomas L.     BP 128/80 (BP Location: Left Arm, Patient Position: Sitting, Cuff Size: Normal)   Pulse 89   Temp 99.1 F (37.3 C) (Oral)   Ht 4' 11.5" (1.511 m)   Wt 163 lb 4 oz (74 kg)   SpO2 99%   BMI 32.42 kg/m  General: Awake, alert, appears stated age HEENT: AT, , ears patent b/l and TM's neg, nares patent w/o discharge, +ttp over L max sinus, pharynx pink and without exudates, MMM Neck: No masses or asymmetry Heart: RRR Lungs: CTAB, no accessory muscle use Psych: Age appropriate judgment and insight, normal mood and affect  Acute non-recurrent  maxillary sinusitis - Plan: amoxicillin-clavulanate (AUGMENTIN) 875-125 MG tablet  Pocket rx given with instructions over when to use. Letter for work also given. Continue to push fluids, practice good hand hygiene, cover mouth when coughing. F/u prn. If starting to experience fevers, shaking, or shortness of breath, seek immediate care. Pt voiced understanding and agreement to the plan.  Jilda Rocheicholas Paul [place], DO 07/07/18 2:45 PM

## 2018-08-24 ENCOUNTER — Telehealth: Payer: Self-pay

## 2018-08-24 MED ORDER — FAMOTIDINE 40 MG PO TABS: 40.0000 mg | ORAL_TABLET | Freq: Every day | ORAL | 3 refills | Status: DC

## 2018-08-24 NOTE — Telephone Encounter (Signed)
Called her- she tried pepcid 20 mg OTC but it did not work that well. Called in 40 mg of famotidine for her to use daily as needed  She will let me know if not helpful

## 2018-08-24 NOTE — Telephone Encounter (Signed)
Copied from CRM [id number]. Topic: General - Inquiry >> Aug 24, 2018 11:02 AM Angela Wall wrote: Reason for CRM: Patient is calling to advise ranitidine (ZANTAC) 150 MG capsule is on recall and no longer sold. She stated this medication worked very well. She is wanting to know what other medication she can take. She is requesting a call back please advise

## 2018-08-24 NOTE — Addendum Note (Signed)
Addended by: Abbe Amsterdam C on: 08/24/2018 12:46 PM   Modules accepted: Orders

## 2018-10-21 ENCOUNTER — Other Ambulatory Visit: Payer: Self-pay | Admitting: Family Medicine

## 2018-10-21 DIAGNOSIS — Z5181 Encounter for therapeutic drug level monitoring: Secondary | ICD-10-CM

## 2018-10-22 NOTE — Progress Notes (Addendum)
[facility] [address], [address] [place], [place] [address] [phone] [phone]  Date:  10/27/2018   Name:  Angela Wall   DOB:  1966-08-08   MRN:  [id number]  PCP:  Pearline Cablesopland, Sherlynn Tourville C, MD    Chief Complaint: Annual Exam (flu shot? Hair loss0losing hair when brushing in the mornings); Arm Pain (worse at night, picked up heavy bags couple weeks ago, limited ROM); and Memory Loss (forgetting things more often)   History of Present Illness:  Angela Lintamatha S Wimmer is a 52 y.o. very pleasant female patient who presents with the following:  Here today for complete physical exam.  History of hyperlipidemia, cervical spinal stenosis, mild prediabetes She also saw cardiology in May with concern of lower extremity edema.  Her calcium channel blocker was stopped, and she was asked to restrict sodium  She underwent an echo which was normal  BP Readings from Last 3 Encounters:  10/27/18 (!) 144/82  07/07/18 128/80  03/11/18 114/82   Pap:2018, [facility]  Mammo: done per OBG Colon: 2011, pelvic collection she was asked to come back in 10 years Labs: Due for CMP, A1c, lipid- she is fasting today  Immunizations: flu shot today Discussed shingrix with her.  She wishes to wait on this   Lab Results  Component Value Date   HGBA1C 5.7 05/26/2017   She has noted an issue with her left arm-possible injury.  She was carrying some heavy groceries and may have injured herself about 3 weeks ago.  She did not have pain right away, but noted pain over the following days and weeks.  She notes pain over the lateal elbow and the shoulder, more so at night when she is laying down.   Driving also hurts  It is not getting better yet  She also notes that her memory is not as good as it was in the past - she has noted this for a couple of weeks per her report.  Her partner may have also noticed this issue  She is taking losartan 100 and lasix  40 mg daily; her swelling is much better since she stopped the CCB She is not checking her BP at home She does normally take her BP meds around this time each day; did not take yet today   Patient Active Problem List   Diagnosis Date Noted  . Plantar fasciitis, left 05/24/2018  . Tendonitis, Achilles, right 05/24/2018  . SOB (shortness of breath) 03/07/2018  . Abnormal bruising 12/09/2015  . Breast pain, left 05/23/2015  . Edema, lower extremity 05/23/2015  . Pain in joint, shoulder region 06/05/2014  . Knee pain, left 06/05/2014  . Insomnia 06/05/2014  . Other abnormal glucose 03/14/2012  . Pure hypercholesterolemia 03/14/2012  . SPINAL STENOSIS, CERVICAL 03/27/2010  . Cervical spondylosis with myelopathy 03/19/2010  . LACTOSE INTOLERANCE 12/31/2009  . GERD 07/08/2009  . Essential hypertension 07/05/2009    Past Medical History:  Diagnosis Date  . Abnormal bruising 12/09/2015  . Breast pain, left 05/23/2015  . Cervical spondylosis with myelopathy 03/19/2010   Qualifier: Diagnosis of  By: Yetta BarreJones MD, Bernadene Bellhomas L.   . Edema 05/23/2015  . Essential hypertension 07/05/2009   Qualifier: Diagnosis of  By: Yetta BarreJones MD, Bernadene Bellhomas L.   Marland Kitchen. GERD 07/08/2009   Qualifier: Diagnosis of  By: Yetta BarreJones MD, Bernadene Bellhomas L.   Marland Kitchen. GERD (gastroesophageal reflux disease)   . Hypertension   . Insomnia 06/05/2014  .  Knee pain, left 06/05/2014  . LACTOSE INTOLERANCE 12/31/2009   Qualifier: Diagnosis of  By: Jarold MottoPatterson MD Lang SnowFACG, David R   . Other abnormal glucose 03/14/2012  . Pain in joint involving right ankle and foot 11/06/2015  . Pain in joint, shoulder region 06/05/2014  . Pure hypercholesterolemia 03/14/2012  . SPINAL STENOSIS, CERVICAL 03/27/2010   Qualifier: Diagnosis of  By: Yetta BarreJones MD, Bernadene Bellhomas L.     Past Surgical History:  Procedure Laterality Date  . BTL post-bilateral ectopics    . ENDOMETRIAL ABLATION     ? Novasure  . TUBAL LIGATION      Social History   Tobacco Use  . Smoking status: Never Smoker  .  Smokeless tobacco: Never Used  Substance Use Topics  . Alcohol use: Yes    Comment: social drinker  . Drug use: No    Family History  Problem Relation Age of Onset  . Cancer Mother        Breast cancer <50  . Kidney disease Maternal Uncle   . Stroke Maternal Uncle   . Hypertension Other   . Heart attack Paternal Grandmother     Allergies  Allergen Reactions  . Oxycodone-Acetaminophen Itching  . Oxycodone-Acetaminophen     REACTION: Itching  . Propoxyphene N-Acetaminophen     REACTION: Itching    Medication list has been reviewed and updated.  Current Outpatient Medications on File Prior to Visit  Medication Sig Dispense Refill  . Biotin 0981110000 MCG TABS Take by mouth.    . famotidine (PEPCID) 40 MG tablet Take 1 tablet (40 mg total) by mouth daily. Use as needed for gerd 90 tablet 3  . furosemide (LASIX) 20 MG tablet Take 2 tablets (40 mg total) by mouth daily. 180 tablet 0  . losartan (COZAAR) 100 MG tablet Take 1 tablet (100 mg total) by mouth daily. 90 tablet 1   No current facility-administered medications on file prior to visit.     Review of Systems:  As per HPI- otherwise negative. No fever or chills, no chest pain or shortness of breath.  She does do some walking at work, but admits she does not otherwise get much exercise Wt Readings from Last 3 Encounters:  10/27/18 165 lb (74.8 kg)  07/07/18 163 lb 4 oz (74 kg)  03/11/18 163 lb 12.8 oz (74.3 kg)     Physical Examination: Vitals:   10/27/18 0858  BP: (!) 144/82  Pulse: 99  Resp: 16  SpO2: 99%   Vitals:   10/27/18 0858  Weight: 165 lb (74.8 kg)  Height: 4' 11.5" (1.511 m)   Body mass index is 32.77 kg/m. Ideal Body Weight: Weight in (lb) to have BMI = 25: 125.6  GEN: WDWN, NAD, Non-toxic, A & O x 3, overweight, looks well HEENT: Atraumatic, Normocephalic. Neck supple. No masses, No LAD.Bilateral TM wnl, oropharynx normal.  PEERL,EOMI.   Ears and Nose: No external deformity. CV: RRR, No  M/G/R. No JVD. No thrill. No extra heart sounds. PULM: CTA B, no wheezes, crackles, rhonchi. No retractions. No resp. distress. No accessory muscle use. ABD: S, NT, ND, +BS. No rebound. No HSM. EXTR: No c/c/e NEURO Normal gait.  PSYCH: Normally interactive. Conversant. Not depressed or anxious appearing.  Calm demeanor.  She has tenderness over the left lateral epicondyle.  Normal range of motion of the shoulder, normal strength and biceps reflex of both upper extremities I do not appreciate signs of rotator cuff tendinitis at this time, but she does seem  to have lateral epicondylitis  Assessment and Plan: Physical exam  Localized swelling of lower extremity  Screening for diabetes mellitus - Plan: Hemoglobin A1c  Essential hypertension - Plan: Comprehensive metabolic panel  Pure hypercholesterolemia - Plan: Lipid panel  Screening for deficiency anemia - Plan: CBC  Today for complete physical. She does see GYN, and is up-to-date on her colonoscopy and Pap smear. Given flu shot today, and will obtain fasting labs. Blood pressure is borderline today, but she has not yet taken her medications.  Her last several blood pressure readings have been normal. Encouraged exercise, for health and weight maintenance/loss. Gave patient handouts regarding elbow and shoulder tendinitis.  Asked her to let me know if not better in the next few weeks. Patient also mentions a concern about her memory.  Advised her that we do not have time to go over this issue as well today, but asked her to please discuss this with her partner and take note of any persistent symptoms.  I can certainly refer to neurology if need be  Signed Abbe Amsterdam, MD  Received her labs, message to patient  Results for orders placed or performed in visit on 10/27/18  Comprehensive metabolic panel  Result Value Ref Range   Sodium 139 135 - 145 mEq/L   Potassium 3.9 3.5 - 5.1 mEq/L   Chloride 105 96 - 112 mEq/L   CO2 28 19  - 32 mEq/L   Glucose, Bld 105 (H) 70 - 99 mg/dL   BUN 11 6 - 23 mg/dL   Creatinine, Ser 1.61 0.40 - 1.20 mg/dL   Total Bilirubin 0.8 0.2 - 1.2 mg/dL   Alkaline Phosphatase 78 39 - 117 U/L   AST 14 0 - 37 U/L   ALT 12 0 - 35 U/L   Total Protein 6.3 6.0 - 8.3 g/dL   Albumin 3.9 3.5 - 5.2 g/dL   Calcium 9.2 8.4 - 09.6 mg/dL   GFR 04.54 >09.81 mL/min  Hemoglobin A1c  Result Value Ref Range   Hgb A1c MFr Bld 5.8 4.6 - 6.5 %  Lipid panel  Result Value Ref Range   Cholesterol 152 0 - 200 mg/dL   Triglycerides 19.1 0.0 - 149.0 mg/dL   HDL 47.82 >95.62 mg/dL   VLDL 13.0 0.0 - 86.5 mg/dL   LDL Cholesterol 83 0 - 99 mg/dL   Total CHOL/HDL Ratio 3    NonHDL 99.86   CBC  Result Value Ref Range   WBC 7.4 4.0 - 10.5 K/uL   RBC 4.58 3.87 - 5.11 Mil/uL   Platelets 256.0 150.0 - 400.0 K/uL   Hemoglobin 12.8 12.0 - 15.0 g/dL   HCT 78.4 69.6 - 29.5 %   MCV 84.4 78.0 - 100.0 fl   MCHC 33.2 30.0 - 36.0 g/dL   RDW 28.4 13.2 - 44.0 %

## 2018-10-24 MED ORDER — FUROSEMIDE 20 MG PO TABS: 40.0000 mg | ORAL_TABLET | Freq: Every day | ORAL | 0 refills | Status: DC

## 2018-10-24 MED ORDER — LOSARTAN POTASSIUM 100 MG PO TABS: 100.0000 mg | ORAL_TABLET | Freq: Every day | ORAL | 1 refills | Status: DC

## 2018-10-24 NOTE — Telephone Encounter (Signed)
Patient calling and states that her pharmacy is still needing the prescription for furosemide (LASIX) 20 MG tablet. States that they do not have a prescription for her for this. Advised that the prescription that was sent on 12/06/17 was for a 90 day supply with 3 refills. Patient states that they have not been giving her 90 day supplies, only 30 days. Patient would like a new prescription for 90 day supply sent to pharmacy. [id number] - [place], [place] - [address]

## 2018-10-24 NOTE — Addendum Note (Signed)
Addended by: Wilford CornerOVINGTON, Dalayla Aldredge W on: 10/24/2018 04:56 PM   Modules accepted: Orders

## 2018-10-24 NOTE — Addendum Note (Signed)
Addended by: Wilford CornerOVINGTON, Benigna Delisi W on: 10/24/2018 04:12 PM   Modules accepted: Orders

## 2018-10-24 NOTE — Telephone Encounter (Signed)
Pt states that with insurance it is cheaper for her to get a 90 day supply and wants to know if medications can be called in that way.  Pt is needing: famotidine (PEPCID) 40 MG tablet     furosemide (LASIX) 20 MG tablet    losartan (COZAAR) 100 MG tablet

## 2018-10-24 NOTE — Telephone Encounter (Signed)
Patient called, left VM to return call to the office or call her pharmacy about the 90 day request of medications. Pepcid #90/3 refills sent on 08/24/18; Furosemide #180/3 refills sent on 12/06/17. Losartan will be resubmitted for a 90 day supply.

## 2018-10-27 ENCOUNTER — Encounter: Payer: Self-pay | Admitting: Family Medicine

## 2018-10-27 ENCOUNTER — Ambulatory Visit (INDEPENDENT_AMBULATORY_CARE_PROVIDER_SITE_OTHER): Payer: BLUE CROSS/BLUE SHIELD | Admitting: Family Medicine

## 2018-10-27 VITALS — BP 144/82 | HR 99 | Resp 16 | Ht 59.5 in | Wt 165.0 lb

## 2018-10-27 DIAGNOSIS — Z131 Encounter for screening for diabetes mellitus: Secondary | ICD-10-CM | POA: Diagnosis not present

## 2018-10-27 DIAGNOSIS — E7800 Pure hypercholesterolemia, unspecified: Secondary | ICD-10-CM

## 2018-10-27 DIAGNOSIS — Z130 Encounter for screening for diseases of the blood and blood-forming organs and certain disorders involving the immune mechanism: Secondary | ICD-10-CM

## 2018-10-27 DIAGNOSIS — Z0000 Encounter for general adult medical examination without abnormal findings: Secondary | ICD-10-CM | POA: Diagnosis not present

## 2018-10-27 DIAGNOSIS — I10 Essential (primary) hypertension: Secondary | ICD-10-CM

## 2018-10-27 DIAGNOSIS — M7989 Other specified soft tissue disorders: Secondary | ICD-10-CM

## 2018-10-27 DIAGNOSIS — Z23 Encounter for immunization: Secondary | ICD-10-CM | POA: Diagnosis not present

## 2018-10-27 LAB — LIPID PANEL
Cholesterol: 152 mg/dL (ref 0–200)
HDL: 52.1 mg/dL (ref >39.00)
LDL Cholesterol: 83 mg/dL (ref 0–99)
NonHDL: 99.86
Total CHOL/HDL Ratio: 3
Triglycerides: 84 mg/dL (ref 0.0–149.0)
VLDL: 16.8 mg/dL (ref 0.0–40.0)

## 2018-10-27 LAB — COMPREHENSIVE METABOLIC PANEL
ALBUMIN: 3.9 g/dL (ref 3.5–5.2)
ALK PHOS: 78 U/L (ref 39–117)
ALT: 12 U/L (ref 0–35)
AST: 14 U/L (ref 0–37)
BUN: 11 mg/dL (ref 6–23)
CO2: 28 mEq/L (ref 19–32)
Calcium: 9.2 mg/dL (ref 8.4–10.5)
Chloride: 105 mEq/L (ref 96–112)
Creatinine, Ser: 0.82 mg/dL (ref 0.40–1.20)
GFR: 94.67 mL/min (ref >60.00)
Glucose, Bld: 105 mg/dL — ABNORMAL HIGH (ref 70–99)
Potassium: 3.9 mEq/L (ref 3.5–5.1)
Sodium: 139 mEq/L (ref 135–145)
Total Bilirubin: 0.8 mg/dL (ref 0.2–1.2)
Total Protein: 6.3 g/dL (ref 6.0–8.3)

## 2018-10-27 LAB — CBC
HCT: 38.7 % (ref 36.0–46.0)
HEMOGLOBIN: 12.8 g/dL (ref 12.0–15.0)
MCHC: 33.2 g/dL (ref 30.0–36.0)
MCV: 84.4 fl (ref 78.0–100.0)
Platelets: 256 10*3/uL (ref 150.0–400.0)
RBC: 4.58 Mil/uL (ref 3.87–5.11)
RDW: 13.4 % (ref 11.5–15.5)
WBC: 7.4 10*3/uL (ref 4.0–10.5)

## 2018-10-27 LAB — HEMOGLOBIN A1C: Hgb A1c MFr Bld: 5.8 % (ref 4.6–6.5)

## 2018-10-27 NOTE — Patient Instructions (Addendum)
It was good to see you today, I will be in touch with your labs ASAP. Please do work on getting more exercise in your daily life.  You may find that there is an automatic step counter on your phone.  Aim for at least 10,000 steps per day, which you can get through combination of daily activity and structured walking for exercise. Lifting weights is also a good way to maintain your muscle mass and avoid weight gain with age  You got your flu shot today Please think about getting the shingles vaccine at your convenience I gave you some exercises to do for your left elbow and shoulder.  I would recommend using Tylenol as needed for pain, as opposed to ibuprofen/ aleve which could raise your blood pressure. You may also find that wearing an over-the-counter tennis elbow strap is helpful Please let me know if this is not improved in the next few weeks.  Please talk to your partner in more detail about your memory, and take note yourself of any memory problems. If this continues to be a concern, please alert me and I will refer you to neurology for consultation  Health Maintenance, Female Adopting a healthy lifestyle and getting preventive care can go a long way to promote health and wellness. Talk with your health care provider about what schedule of regular examinations is right for you. This is a good chance for you to check in with your provider about disease prevention and staying healthy. In between checkups, there are plenty of things you can do on your own. Experts have done a lot of research about which lifestyle changes and preventive measures are most likely to keep you healthy. Ask your health care provider for more information. Weight and diet Eat a healthy diet  Be sure to include plenty of vegetables, fruits, low-fat dairy products, and lean protein.  Do not eat a lot of foods high in solid fats, added sugars, or salt.  Get regular exercise. This is one of the most important things  you can do for your health. ? Most adults should exercise for at least 150 minutes each week. The exercise should increase your heart rate and make you sweat (moderate-intensity exercise). ? Most adults should also do strengthening exercises at least twice a week. This is in addition to the moderate-intensity exercise. Maintain a healthy weight  Body mass index (BMI) is a measurement that can be used to identify possible weight problems. It estimates body fat based on height and weight. Your health care provider can help determine your BMI and help you achieve or maintain a healthy weight.  For females 59 years of age and older: ? A BMI below 18.5 is considered underweight. ? A BMI of 18.5 to 24.9 is normal. ? A BMI of 25 to 29.9 is considered overweight. ? A BMI of 30 and above is considered obese. Watch levels of cholesterol and blood lipids  You should start having your blood tested for lipids and cholesterol at 51 years of age, then have this test every 5 years.  You may need to have your cholesterol levels checked more often if: ? Your lipid or cholesterol levels are high. ? You are older than 52 years of age. ? You are at high risk for heart disease. Cancer screening Lung Cancer  Lung cancer screening is recommended for adults 78-46 years old who are at high risk for lung cancer because of a history of smoking.  A yearly low-dose CT  scan of the lungs is recommended for people who: ? Currently smoke. ? Have quit within the past 15 years. ? Have at least a 30-pack-year history of smoking. A pack year is smoking an average of one pack of cigarettes a day for 1 year.  Yearly screening should continue until it has been 15 years since you quit.  Yearly screening should stop if you develop a health problem that would prevent you from having lung cancer treatment. Breast Cancer  Practice breast self-awareness. This means understanding how your breasts normally appear and feel.  It  also means doing regular breast self-exams. Let your health care provider know about any changes, no matter how small.  If you are in your 20s or 30s, you should have a clinical breast exam (CBE) by a health care provider every 1-3 years as part of a regular health exam.  If you are 9 or older, have a CBE every year. Also consider having a breast X-ray (mammogram) every year.  If you have a family history of breast cancer, talk to your health care provider about genetic screening.  If you are at high risk for breast cancer, talk to your health care provider about having an MRI and a mammogram every year.  Breast cancer gene (BRCA) assessment is recommended for women who have family members with BRCA-related cancers. BRCA-related cancers include: ? Breast. ? Ovarian. ? Tubal. ? Peritoneal cancers.  Results of the assessment will determine the need for genetic counseling and BRCA1 and BRCA2 testing. Cervical Cancer Your health care provider may recommend that you be screened regularly for cancer of the pelvic organs (ovaries, uterus, and vagina). This screening involves a pelvic examination, including checking for microscopic changes to the surface of your cervix (Pap test). You may be encouraged to have this screening done every 3 years, beginning at age 28.  For women ages 29-65, health care providers may recommend pelvic exams and Pap testing every 3 years, or they may recommend the Pap and pelvic exam, combined with testing for human papilloma virus (HPV), every 5 years. Some types of HPV increase your risk of cervical cancer. Testing for HPV may also be done on women of any age with unclear Pap test results.  Other health care providers may not recommend any screening for nonpregnant women who are considered low risk for pelvic cancer and who do not have symptoms. Ask your health care provider if a screening pelvic exam is right for you.  If you have had past treatment for cervical cancer  or a condition that could lead to cancer, you need Pap tests and screening for cancer for at least 20 years after your treatment. If Pap tests have been discontinued, your risk factors (such as having a new sexual partner) need to be reassessed to determine if screening should resume. Some women have medical problems that increase the chance of getting cervical cancer. In these cases, your health care provider may recommend more frequent screening and Pap tests. Colorectal Cancer  This type of cancer can be detected and often prevented.  Routine colorectal cancer screening usually begins at 51 years of age and continues through 51 years of age.  Your health care provider may recommend screening at an earlier age if you have risk factors for colon cancer.  Your health care provider may also recommend using home test kits to check for hidden blood in the stool.  A small camera at the end of a tube can be used to  examine your colon directly (sigmoidoscopy or colonoscopy). This is done to check for the earliest forms of colorectal cancer.  Routine screening usually begins at age 41.  Direct examination of the colon should be repeated every 5-10 years through 51 years of age. However, you may need to be screened more often if early forms of precancerous polyps or small growths are found. Skin Cancer  Check your skin from head to toe regularly.  Tell your health care provider about any new moles or changes in moles, especially if there is a change in a mole's shape or color.  Also tell your health care provider if you have a mole that is larger than the size of a pencil eraser.  Always use sunscreen. Apply sunscreen liberally and repeatedly throughout the day.  Protect yourself by wearing long sleeves, pants, a wide-brimmed hat, and sunglasses whenever you are outside. Heart disease, diabetes, and high blood pressure  High blood pressure causes heart disease and increases the risk of stroke.  High blood pressure is more likely to develop in: ? People who have blood pressure in the high end of the normal range (130-139/85-89 mm Hg). ? People who are overweight or obese. ? People who are African American.  If you are 34-13 years of age, have your blood pressure checked every 3-5 years. If you are 67 years of age or older, have your blood pressure checked every year. You should have your blood pressure measured twice-once when you are at a hospital or clinic, and once when you are not at a hospital or clinic. Record the average of the two measurements. To check your blood pressure when you are not at a hospital or clinic, you can use: ? An automated blood pressure machine at a pharmacy. ? A home blood pressure monitor.  If you are between 76 years and 98 years old, ask your health care provider if you should take aspirin to prevent strokes.  Have regular diabetes screenings. This involves taking a blood sample to check your fasting blood sugar level. ? If you are at a normal weight and have a low risk for diabetes, have this test once every three years after 51 years of age. ? If you are overweight and have a high risk for diabetes, consider being tested at a younger age or more often. Preventing infection Hepatitis B  If you have a higher risk for hepatitis B, you should be screened for this virus. You are considered at high risk for hepatitis B if: ? You were born in a country where hepatitis B is common. Ask your health care provider which countries are considered high risk. ? Your parents were born in a high-risk country, and you have not been immunized against hepatitis B (hepatitis B vaccine). ? You have HIV or AIDS. ? You use needles to inject street drugs. ? You live with someone who has hepatitis B. ? You have had sex with someone who has hepatitis B. ? You get hemodialysis treatment. ? You take certain medicines for conditions, including cancer, organ transplantation, and  autoimmune conditions. Hepatitis C  Blood testing is recommended for: ? Everyone born from [birth year] through 1963. ? Anyone with known risk factors for hepatitis C. Sexually transmitted infections (STIs)  You should be screened for sexually transmitted infections (STIs) including gonorrhea and chlamydia if: ? You are sexually active and are younger than 51 years of age. ? You are older than 51 years of age and your health care provider  tells you that you are at risk for this type of infection. ? Your sexual activity has changed since you were last screened and you are at an increased risk for chlamydia or gonorrhea. Ask your health care provider if you are at risk.  If you do not have HIV, but are at risk, it may be recommended that you take a prescription medicine daily to prevent HIV infection. This is called pre-exposure prophylaxis (PrEP). You are considered at risk if: ? You are sexually active and do not regularly use condoms or know the HIV status of your partner(s). ? You take drugs by injection. ? You are sexually active with a partner who has HIV. Talk with your health care provider about whether you are at high risk of being infected with HIV. If you choose to begin PrEP, you should first be tested for HIV. You should then be tested every 3 months for as long as you are taking PrEP. Pregnancy  If you are premenopausal and you may become pregnant, ask your health care provider about preconception counseling.  If you may become pregnant, take 400 to 800 micrograms (mcg) of folic acid every day.  If you want to prevent pregnancy, talk to your health care provider about birth control (contraception). Osteoporosis and menopause  Osteoporosis is a disease in which the bones lose minerals and strength with aging. This can result in serious bone fractures. Your risk for osteoporosis can be identified using a bone density scan.  If you are 38 years of age or older, or if you are at risk for  osteoporosis and fractures, ask your health care provider if you should be screened.  Ask your health care provider whether you should take a calcium or vitamin D supplement to lower your risk for osteoporosis.  Menopause may have certain physical symptoms and risks.  Hormone replacement therapy may reduce some of these symptoms and risks. Talk to your health care provider about whether hormone replacement therapy is right for you. Follow these instructions at home:  Schedule regular health, dental, and eye exams.  Stay current with your immunizations.  Do not use any tobacco products including cigarettes, chewing tobacco, or electronic cigarettes.  If you are pregnant, do not drink alcohol.  If you are breastfeeding, limit how much and how often you drink alcohol.  Limit alcohol intake to no more than 1 drink per day for nonpregnant women. One drink equals 12 ounces of beer, 5 ounces of wine, or 1 ounces of hard liquor.  Do not use street drugs.  Do not share needles.  Ask your health care provider for help if you need support or information about quitting drugs.  Tell your health care provider if you often feel depressed.  Tell your health care provider if you have ever been abused or do not feel safe at home. This information is not intended to replace advice given to you by your health care provider. Make sure you discuss any questions you have with your health care provider. Document Released: 04/27/2011 Document Revised: 03/19/2016 Document Reviewed: 07/16/2015 Elsevier Interactive Patient Education  2019 [employer].

## 2018-12-29 ENCOUNTER — Other Ambulatory Visit: Payer: Self-pay | Admitting: Family Medicine

## 2018-12-29 ENCOUNTER — Encounter: Payer: Self-pay | Admitting: Family Medicine

## 2018-12-29 ENCOUNTER — Ambulatory Visit: Payer: BLUE CROSS/BLUE SHIELD | Admitting: Family Medicine

## 2018-12-29 VITALS — BP 118/86 | HR 108 | Temp 99.1°F | Ht 59.5 in | Wt 163.0 lb

## 2018-12-29 DIAGNOSIS — J069 Acute upper respiratory infection, unspecified: Secondary | ICD-10-CM | POA: Diagnosis not present

## 2018-12-29 DIAGNOSIS — B9789 Other viral agents as the cause of diseases classified elsewhere: Secondary | ICD-10-CM | POA: Diagnosis not present

## 2018-12-29 DIAGNOSIS — Z5181 Encounter for therapeutic drug level monitoring: Secondary | ICD-10-CM

## 2018-12-29 MED ORDER — METHYLPREDNISOLONE ACETATE 80 MG/ML IJ SUSP
80.0000 mg | Freq: Once | INTRAMUSCULAR | Status: AC
  Administered 2018-12-29: 80 mg via INTRAMUSCULAR

## 2018-12-29 MED ORDER — LEVOCETIRIZINE DIHYDROCHLORIDE 5 MG PO TABS: 5.0000 mg | ORAL_TABLET | Freq: Every evening | ORAL | 2 refills | Status: DC

## 2018-12-29 NOTE — Patient Instructions (Addendum)
Continue to push fluids, practice good hand hygiene, and cover your mouth if you cough.  If you start having fevers, shaking or shortness of breath, seek immediate care.  For symptoms, consider using Vick's VapoRub on chest or under nose, air humidifier, Benadryl at night, and elevating the head of the bed. Tylenol and ibuprofen for aches and pains you may be experiencing.   Claritin (loratadine), Allegra (fexofenadine), Zyrtec (cetirizine); these are listed in order from weakest to strongest. Generic, and therefore cheaper, options are in the parentheses.   There are available OTC, and the generic versions, which may be cheaper, are in parentheses. Show this to a pharmacist if you have trouble finding any of these items.  Let us know if you need anything.

## 2018-12-29 NOTE — Addendum Note (Signed)
Addended by: Scharlene Gloss B on: 12/29/2018 03:50 PM   Modules accepted: Orders

## 2018-12-29 NOTE — Progress Notes (Signed)
Chief Complaint  Patient presents with  . Cough    congestion  . Ear Fullness  . Fatigue    Angela Wall here for URI complaints.  Duration: 2 days  Associated symptoms: fever 99.8 F, sinus congestion, rhinorrhea, ear pain, sore throat, wheezing, shortness of breath, cough and fatigue Denies: sinus pain, itchy watery eyes, ear drainage and myalgia Treatment to date: Dayquil Sick contacts: No  ROS:  Const: + fevers HEENT: As noted in HPI Lungs: +cough  Past Medical History:  Diagnosis Date  . Abnormal bruising 12/09/2015  . Breast pain, left 05/23/2015  . Cervical spondylosis with myelopathy 03/19/2010   Qualifier: Diagnosis of  By: Yetta Barre MD, Bernadene Bell.   . Edema 05/23/2015  . Essential hypertension 07/05/2009   Qualifier: Diagnosis of  By: Yetta Barre MD, Bernadene Bell.   Marland Kitchen GERD 07/08/2009   Qualifier: Diagnosis of  By: Yetta Barre MD, Bernadene Bell.   Marland Kitchen GERD (gastroesophageal reflux disease)   . Hypertension   . Insomnia 06/05/2014  . Knee pain, left 06/05/2014  . LACTOSE INTOLERANCE 12/31/2009   Qualifier: Diagnosis of  By: Jarold Motto MD Lang Snow   . Other abnormal glucose 03/14/2012  . Pain in joint involving right ankle and foot 11/06/2015  . Pain in joint, shoulder region 06/05/2014  . Pure hypercholesterolemia 03/14/2012  . SPINAL STENOSIS, CERVICAL 03/27/2010   Qualifier: Diagnosis of  By: Yetta Barre MD, Maisie Fus L.     BP 118/86 (BP Location: Left Arm, Patient Position: Sitting, Cuff Size: Normal)   Pulse (!) 108   Temp 99.1 F (37.3 C) (Oral)   Ht 4' 11.5" (1.511 m)   Wt 163 lb (73.9 kg)   SpO2 98%   BMI 32.37 kg/m  General: Awake, alert, appears stated age HEENT: AT, [place], ears patent b/l and TM's neg, nares patent w/o discharge, pharynx pink and without exudates, MMM, +clear drainage of pharynx Neck: No masses or asymmetry Heart: RRR Lungs: CTAB, no accessory muscle use Psych: Age appropriate judgment and insight, normal mood and affect  Viral URI with cough  PO antihistamine. IM  Depo 80 mg. I think if we can dry things up, it will help with her voice, cough and throat.  Continue to push fluids, practice good hand hygiene, cover mouth when coughing. F/u prn. If starting to experience fevers, shaking, or shortness of breath, seek immediate care. Pt voiced understanding and agreement to the plan.  [name], DO 12/29/18 3:24 PM

## 2018-12-31 ENCOUNTER — Encounter: Payer: Self-pay | Admitting: Family Medicine

## 2019-01-02 ENCOUNTER — Encounter: Payer: Self-pay | Admitting: Family Medicine

## 2019-01-02 ENCOUNTER — Other Ambulatory Visit: Payer: Self-pay | Admitting: Family Medicine

## 2019-01-02 MED ORDER — AZITHROMYCIN 250 MG PO TABS: ORAL_TABLET | ORAL | 0 refills | Status: DC

## 2019-01-09 ENCOUNTER — Telehealth: Payer: Self-pay | Admitting: Family Medicine

## 2019-01-09 ENCOUNTER — Encounter: Payer: Self-pay | Admitting: Family Medicine

## 2019-01-09 MED ORDER — OLMESARTAN MEDOXOMIL 40 MG PO TABS: 40.0000 mg | ORAL_TABLET | Freq: Every day | ORAL | 3 refills | Status: DC

## 2019-01-09 NOTE — Telephone Encounter (Signed)
Copied from CRM [id number]. Topic: Quick Communication - Rx Refill/Question >> Jan 09, 2019 11:24 AM Crist Infante wrote: Medication: losartan (COZAAR) 100 MG tablet  This med is on back order/national recall and the pharmacy suggested the pt call her doctor to have this changed to something else Telmisartan and olmesartan. Whichever the dr feels is best for her. 90 day [id number] [place], [place] - [address] [phone] (Phone) [phone] (Fax)

## 2019-01-09 NOTE — Telephone Encounter (Signed)
Please advise 

## 2019-01-20 ENCOUNTER — Other Ambulatory Visit: Payer: Self-pay | Admitting: Family Medicine

## 2019-03-21 DIAGNOSIS — N751 Abscess of Bartholin's gland: Secondary | ICD-10-CM | POA: Diagnosis not present

## 2019-03-21 DIAGNOSIS — Z6832 Body mass index (BMI) 32.0-32.9, adult: Secondary | ICD-10-CM | POA: Diagnosis not present

## 2019-03-28 DIAGNOSIS — Z01419 Encounter for gynecological examination (general) (routine) without abnormal findings: Secondary | ICD-10-CM | POA: Diagnosis not present

## 2019-03-28 DIAGNOSIS — Z6830 Body mass index (BMI) 30.0-30.9, adult: Secondary | ICD-10-CM | POA: Diagnosis not present

## 2019-03-28 DIAGNOSIS — Z1231 Encounter for screening mammogram for malignant neoplasm of breast: Secondary | ICD-10-CM | POA: Diagnosis not present

## 2019-04-01 ENCOUNTER — Other Ambulatory Visit: Payer: Self-pay | Admitting: Family Medicine

## 2019-04-01 DIAGNOSIS — Z5181 Encounter for therapeutic drug level monitoring: Secondary | ICD-10-CM

## 2019-04-12 ENCOUNTER — Telehealth: Payer: Self-pay | Admitting: Medical

## 2019-04-12 ENCOUNTER — Other Ambulatory Visit: Payer: Self-pay

## 2019-04-12 ENCOUNTER — Ambulatory Visit (INDEPENDENT_AMBULATORY_CARE_PROVIDER_SITE_OTHER): Payer: BC Managed Care – PPO | Admitting: Medical

## 2019-04-12 ENCOUNTER — Encounter: Payer: Self-pay | Admitting: Medical

## 2019-04-12 VITALS — BP 161/103

## 2019-04-12 DIAGNOSIS — I10 Essential (primary) hypertension: Secondary | ICD-10-CM | POA: Diagnosis not present

## 2019-04-12 DIAGNOSIS — M7711 Lateral epicondylitis, right elbow: Secondary | ICD-10-CM | POA: Diagnosis not present

## 2019-04-12 DIAGNOSIS — M549 Dorsalgia, unspecified: Secondary | ICD-10-CM | POA: Diagnosis not present

## 2019-04-12 MED ORDER — AMLODIPINE BESYLATE 5 MG PO TABS: 5.0000 mg | ORAL_TABLET | Freq: Every day | ORAL | 0 refills | Status: DC

## 2019-04-12 NOTE — Progress Notes (Signed)
Subjective:    Patient ID: Angela Wall, female    DOB: 12-Mar-1966, 53 y.o.   MRN: [id number]  HPI  Virtual Visit via Video Note  I connected with Angela Lintamatha S Charlie on 04/12/19 at  1:40 PM EDT by a video enabled telemedicine application and verified that I am speaking with the correct person using two identifiers.  Location: Patient: home Provider: home  Pt initial bp 175/103   I discussed the limitations of evaluation and management by telemedicine and the availability of in person appointments. The patient expressed understanding and agreed to proceed.  History of Present Illness:  Pt in with some pain in her elbow. Lateral epicondylitis. Pt states she types a lot. Pt has rt side pain and dominant side. Pt has been using ibuprofen. Pt did not get tennis elbow brace. Pt states she had this pain now for 6 weeks.  Pt states some rt side back pain last night. She states maybe over kidney area. She is not sure. Pain eased up today but last night severe pain. Pt states took ibuprofen and pain level came down from 10 level pain to level 6. No gross blood in urine. No pain on urination. No frequent urination.      Observations/Objective:  General-no acute distress, pleasant, oriented. Lungs- on inspection lungs appear unlabored. Neck- no tracheal deviation or jvd on inspection. Neuro- gross motor function appears intact. Back- faint rt cva tenderness. Skin- no rash. Abdomen- no suprapubic tenderness. Back- some pain on twisting and movement. Faint rt sde cva tenderness   Assessment and Plan: Patient seen for lateral epicondylitis, back pain and elevated blood pressure/history of hypertension.  For lateral epicondylitis type pain present for 6 weeks, I decided to go ahead and get x-ray of her elbow and refer her to sports medicine.  Advised her to stop ibuprofen and will prescribe 4-day taper dose of prednisone. Also get tennis elbow brace and can self massage tendon.   For back pain which seems mostly muscular will see if respond prednisone. But since some cva pain will get ua poct and urine culture. If pain worsens, constant/severe then notify us. Doubt stone but in diff dx.  For bp check level daily. If bp not decreasing less than 140/90 then add amlodipine to regimen.  Follow up in 2 weeks or as needed  Esperanza RichtersEdward Alastair Hennes, PA-C  Follow Up Instructions:    I discussed the assessment and treatment plan with the patient. The patient was provided an opportunity to ask questions and all were answered. The patient agreed with the plan and demonstrated an understanding of the instructions.   The patient was advised to call back or seek an in-person evaluation if the symptoms worsen or if the condition fails to improve as anticipated.  I provided 25  minutes of non-face-to-face time during this encounter. 50% if time counseling on plan going forward and answering questions.   Esperanza RichtersEdward Ermelinda Eckert, PA-C    Review of Systems  Constitutional: Negative for activity change, chills, diaphoresis, fatigue and fever.  Respiratory: Negative for cough, chest tightness and shortness of breath.   Cardiovascular: Negative for chest pain, palpitations and leg swelling.  Gastrointestinal: Negative for abdominal pain, nausea and vomiting.  Musculoskeletal: Positive for back pain. Negative for neck pain and neck stiffness.       See hpi.  Neurological: Negative for dizziness, tremors, syncope, weakness, light-headedness, numbness and headaches.  Psychiatric/Behavioral: Negative for agitation, behavioral problems and confusion. The patient is not nervous/anxious.  Past Medical History:  Diagnosis Date  . Abnormal bruising 12/09/2015  . Breast pain, left 05/23/2015  . Cervical spondylosis with myelopathy 03/19/2010   Qualifier: Diagnosis of  By: Ronnald Ramp MD, Arvid Right.   . Edema 05/23/2015  . Essential hypertension 07/05/2009   Qualifier: Diagnosis of  By: Ronnald Ramp MD, Arvid Right.    Marland Kitchen GERD 07/08/2009   Qualifier: Diagnosis of  By: Ronnald Ramp MD, Arvid Right.   Marland Kitchen GERD (gastroesophageal reflux disease)   . Hypertension   . Insomnia 06/05/2014  . Knee pain, left 06/05/2014  . LACTOSE INTOLERANCE 12/31/2009   Qualifier: Diagnosis of  By: Sharlett Iles MD Byrd Hesselbach   . Other abnormal glucose 03/14/2012  . Pain in joint involving right ankle and foot 11/06/2015  . Pain in joint, shoulder region 06/05/2014  . Pure hypercholesterolemia 03/14/2012  . SPINAL STENOSIS, CERVICAL 03/27/2010   Qualifier: Diagnosis of  By: Ronnald Ramp MD, Arvid Right.      Social History   Socioeconomic History  . Marital status: Divorced    Spouse name: Not on file  . Number of children: Not on file  . Years of education: Not on file  . Highest education level: Not on file  Occupational History  . Not on file  Social Needs  . Financial resource strain: Not on file  . Food insecurity    Worry: Not on file    Inability: Not on file  . Transportation needs    Medical: Not on file    Non-medical: Not on file  Tobacco Use  . Smoking status: Never Smoker  . Smokeless tobacco: Never Used  Substance and Sexual Activity  . Alcohol use: Yes    Comment: social drinker  . Drug use: No  . Sexual activity: Yes    Birth control/protection: Surgical  Lifestyle  . Physical activity    Days per week: Not on file    Minutes per session: Not on file  . Stress: Not on file  Relationships  . Social Herbalist on phone: Not on file    Gets together: Not on file    Attends religious service: Not on file    Active member of club or organization: Not on file    Attends meetings of clubs or organizations: Not on file    Relationship status: Not on file  . Intimate partner violence    Fear of current or ex partner: Not on file    Emotionally abused: Not on file    Physically abused: Not on file    Forced sexual activity: Not on file  Other Topics Concern  . Not on file  Social History Narrative  . Not on  file    Past Surgical History:  Procedure Laterality Date  . BTL post-bilateral ectopics    . ENDOMETRIAL ABLATION     ? Novasure  . TUBAL LIGATION      Family History  Problem Relation Age of Onset  . Cancer Mother        Breast cancer <50  . Kidney disease Maternal Uncle   . Stroke Maternal Uncle   . Hypertension Other   . Heart attack Paternal Grandmother     Allergies  Allergen Reactions  . Oxycodone-Acetaminophen Itching  . Oxycodone-Acetaminophen     REACTION: Itching  . Propoxyphene N-Acetaminophen     REACTION: Itching    Current Outpatient Medications on File Prior to Visit  Medication Sig Dispense Refill  . Biotin 10000 MCG TABS  Take by mouth.    . famotidine (PEPCID) 40 MG tablet Take 1 tablet (40 mg total) by mouth daily. Use as needed for gerd 90 tablet 3  . furosemide (LASIX) 20 MG tablet TAKE 2 TABLETS BY MOUTH EVERY DAY 180 tablet 1  . levocetirizine (XYZAL) 5 MG tablet TAKE 1 TABLET BY MOUTH EVERY DAY IN THE EVENING 30 tablet 2  . olmesartan (BENICAR) 40 MG tablet Take 1 tablet (40 mg total) by mouth daily. 90 tablet 3   No current facility-administered medications on file prior to visit.     BP (!) 161/103       Objective:   Physical Exam        Assessment & Plan:

## 2019-04-12 NOTE — Patient Instructions (Signed)
Patient seen for lateral epicondylitis, back pain and elevated blood pressure/history of hypertension.  For lateral epicondylitis type pain present for 6 weeks, I decided to go ahead and get x-ray of her elbow and refer her to sports medicine.  Advised her to stop ibuprofen and will prescribe 4-day taper dose of prednisone. Also get tennis elbow brace and can self massage tendon.  For back pain which seems mostly muscular will see if respond prednisone. But since some cva pain will get ua poct and urine culture. If pain worsens, constant/severe then notify us. Doubt stone but in diff dx.  For bp check level daily. If bp not decreasing less than 140/90 then add amlodipine to regimen.  Follow up in 2 weeks or as needed

## 2019-04-12 NOTE — Telephone Encounter (Signed)
Patient says PA Saguier forgot to send in prednisone post e-visit today.

## 2019-04-13 ENCOUNTER — Telehealth: Payer: Self-pay | Admitting: Medical

## 2019-04-13 ENCOUNTER — Other Ambulatory Visit: Payer: BC Managed Care – PPO

## 2019-04-13 ENCOUNTER — Other Ambulatory Visit: Payer: Self-pay | Admitting: Medical

## 2019-04-13 DIAGNOSIS — M549 Dorsalgia, unspecified: Secondary | ICD-10-CM

## 2019-04-13 MED ORDER — PREDNISONE 10 MG PO TABS: ORAL_TABLET | ORAL | 0 refills | Status: DC

## 2019-04-13 NOTE — Telephone Encounter (Signed)
rx just sent thanks. . Notify sorry for delay.

## 2019-04-13 NOTE — Telephone Encounter (Signed)
rx prednisone sent to pharmacy

## 2019-04-13 NOTE — Telephone Encounter (Signed)
FYI

## 2019-04-14 ENCOUNTER — Telehealth: Payer: Self-pay | Admitting: Family Medicine

## 2019-04-14 ENCOUNTER — Encounter: Payer: Self-pay | Admitting: Family Medicine

## 2019-04-14 ENCOUNTER — Telehealth: Payer: Self-pay | Admitting: Medical

## 2019-04-14 LAB — URINE CULTURE
MICRO NUMBER:: 583779
SPECIMEN QUALITY:: ADEQUATE

## 2019-04-14 MED ORDER — METOPROLOL SUCCINATE ER 50 MG PO TB24: 50.0000 mg | ORAL_TABLET | Freq: Every day | ORAL | 3 refills | Status: DC

## 2019-04-14 NOTE — Telephone Encounter (Signed)
Pt stated she had a doxy with Dr Harvie Heck yesterday and he wanted her to go back on her amlodipine. Pt forgot to tell him the reason she stopped it was because it made her swell. Pt want to know if there is something else she can take.

## 2019-04-14 NOTE — Telephone Encounter (Signed)
Dc amlodipine. Rx metoprolol.

## 2019-04-14 NOTE — Telephone Encounter (Signed)
FYI

## 2019-04-14 NOTE — Telephone Encounter (Signed)
DC amlodpine. Rx metoprolol.

## 2019-04-14 NOTE — Telephone Encounter (Signed)
Patient called to say that she will not be picking up the  amLODipine (NORVASC) 5 MG tablet from the pharmacy because it caused swelling in the past. She states that her BP is very high and she would like something else sent to the pharmacy for her BP. Please call patient at Ph# [phone]

## 2019-04-14 NOTE — Telephone Encounter (Signed)
Seeing this after hours on Friday. DC amlodipine and rx metoprolol sent to pt pharmacy. I am not in office next week. Would you update pt on Monday mornin.

## 2019-04-17 NOTE — Telephone Encounter (Signed)
Medication sent into pharmacy  

## 2019-04-18 ENCOUNTER — Other Ambulatory Visit: Payer: Self-pay

## 2019-04-18 ENCOUNTER — Ambulatory Visit (HOSPITAL_BASED_OUTPATIENT_CLINIC_OR_DEPARTMENT_OTHER)
Admission: RE | Admit: 2019-04-18 | Discharge: 2019-04-18 | Disposition: A | Discharge status: Home / Self Care | Payer: BC Managed Care – PPO | Source: Ambulatory Visit | Attending: Medical | Admitting: Medical

## 2019-04-18 DIAGNOSIS — M25521 Pain in right elbow: Secondary | ICD-10-CM | POA: Diagnosis not present

## 2019-04-18 DIAGNOSIS — M7711 Lateral epicondylitis, right elbow: Secondary | ICD-10-CM | POA: Insufficient documentation

## 2019-04-19 ENCOUNTER — Ambulatory Visit: Payer: BC Managed Care – PPO | Admitting: Family Medicine

## 2019-04-19 ENCOUNTER — Encounter: Payer: Self-pay | Admitting: Family Medicine

## 2019-04-19 VITALS — BP 128/85 | HR 77 | Ht 60.0 in | Wt 161.0 lb

## 2019-04-19 DIAGNOSIS — M7701 Medial epicondylitis, right elbow: Secondary | ICD-10-CM | POA: Insufficient documentation

## 2019-04-19 MED ORDER — PENNSAID 2 % TD SOLN: 1.0000 "application " | Freq: Two times a day (BID) | TRANSDERMAL | 3 refills | Status: AC

## 2019-04-19 NOTE — Patient Instructions (Signed)
Nice to meet you Please try the rub on medicine.  Please try ice  Please try adjusting your work station. Let me know if you think a [place] would help  Please try the exercises if the pain is minimal   Please send me a message in MyChart with any questions or updates.  Please see me back in 4 weeks.   --Dr. Raeford Razor

## 2019-04-19 NOTE — Progress Notes (Signed)
Angela Wall - 53 y.o. female MRN [id number]  Date of birth: [birth year]  SUBJECTIVE:  Including CC & ROS.  Chief Complaint  Patient presents with  . Elbow Pain    right elbow    Angela Lintamatha S Marconi is a 53 y.o. female that is presenting with right elbow pain.  The pain is occurring over the medial condyle.  The pain is localized to this area.  Has been roughly occurring for 2 months.  It is sharp and stabbing and intermittent in nature.  He can range from mild to severe.  She has been working from home and on the laptop.  It seems to be worse with certain movements or when she is trying to lift objects.  Has tried different medications with limited improvement.  No prior history of similar pain.  No surgery in the area.  Independent review of the right elbow x-rays from 6/23 demonstrate no abnormality.   Review of Systems  Constitutional: Negative for fever.  HENT: Negative for congestion.   Respiratory: Negative for cough.   Cardiovascular: Negative for chest pain.  Gastrointestinal: Negative for abdominal pain.  Musculoskeletal: Negative for back pain.  Skin: Negative for color change.  Neurological: Negative for weakness.  Hematological: Negative for adenopathy.    HISTORY: Past Medical, Surgical, Social, and Family History Reviewed & Updated per EMR.   Pertinent Historical Findings include:  Past Medical History:  Diagnosis Date  . Abnormal bruising 12/09/2015  . Breast pain, left 05/23/2015  . Cervical spondylosis with myelopathy 03/19/2010   Qualifier: Diagnosis of  By: Yetta BarreJones MD, Bernadene Bellhomas L.   . Edema 05/23/2015  . Essential hypertension 07/05/2009   Qualifier: Diagnosis of  By: Yetta BarreJones MD, Bernadene Bellhomas L.   Marland Kitchen. GERD 07/08/2009   Qualifier: Diagnosis of  By: Yetta BarreJones MD, Bernadene Bellhomas L.   Marland Kitchen. GERD (gastroesophageal reflux disease)   . Hypertension   . Insomnia 06/05/2014  . Knee pain, left 06/05/2014  . LACTOSE INTOLERANCE 12/31/2009   Qualifier: Diagnosis of  By: Jarold MottoPatterson MD Lang SnowFACG, David R    . Other abnormal glucose 03/14/2012  . Pain in joint involving right ankle and foot 11/06/2015  . Pain in joint, shoulder region 06/05/2014  . Pure hypercholesterolemia 03/14/2012  . SPINAL STENOSIS, CERVICAL 03/27/2010   Qualifier: Diagnosis of  By: Yetta BarreJones MD, Bernadene Bellhomas L.     Past Surgical History:  Procedure Laterality Date  . BTL post-bilateral ectopics    . ENDOMETRIAL ABLATION     ? Novasure  . TUBAL LIGATION      Allergies  Allergen Reactions  . Oxycodone-Acetaminophen Itching  . Oxycodone-Acetaminophen     REACTION: Itching  . Propoxyphene N-Acetaminophen     REACTION: Itching    Family History  Problem Relation Age of Onset  . Cancer Mother        Breast cancer <50  . Kidney disease Maternal Uncle   . Stroke Maternal Uncle   . Hypertension Other   . Heart attack Paternal Grandmother      Social History   Socioeconomic History  . Marital status: Divorced    Spouse name: Not on file  . Number of children: Not on file  . Years of education: Not on file  . Highest education level: Not on file  Occupational History  . Not on file  Social Needs  . Financial resource strain: Not on file  . Food insecurity    Worry: Not on file    Inability: Not on file  . Transportation  needs    Medical: Not on file    Non-medical: Not on file  Tobacco Use  . Smoking status: Never Smoker  . Smokeless tobacco: Never Used  Substance and Sexual Activity  . Alcohol use: Yes    Comment: social drinker  . Drug use: No  . Sexual activity: Yes    Birth control/protection: Surgical  Lifestyle  . Physical activity    Days per week: Not on file    Minutes per session: Not on file  . Stress: Not on file  Relationships  . Social Herbalist on phone: Not on file    Gets together: Not on file    Attends religious service: Not on file    Active member of club or organization: Not on file    Attends meetings of clubs or organizations: Not on file    Relationship status:  Not on file  . Intimate partner violence    Fear of current or ex partner: Not on file    Emotionally abused: Not on file    Physically abused: Not on file    Forced sexual activity: Not on file  Other Topics Concern  . Not on file  Social History Narrative  . Not on file     PHYSICAL EXAM:  VS: BP 128/85   Pulse 77   Ht 5' (1.524 m)   Wt 161 lb (73 kg)   BMI 31.44 kg/m  Physical Exam Gen: NAD, alert, cooperative with exam, well-appearing ENT: normal lips, normal nasal mucosa,  Eye: normal EOM, normal conjunctiva and lids CV:  no edema, +2 pedal pulses   Resp: no accessory muscle use, non-labored,  Skin: no rashes, no areas of induration  Neuro: normal tone, normal sensation to touch Psych:  normal insight, alert and oriented MSK:  Right elbow: Tenderness to palpation over the medial epicondyle. Negative Tinel's at the cubital tunnel. Normal range of motion. Normal grip strength. Exacerbation of pain with resistance to wrist flexion. Neurovascular intact     ASSESSMENT & PLAN:   Medial epicondylitis of right elbow Likely related to her work environment while working at home on the laptop.  Most consistent with medial epicondylitis.  Does not appear to be cubital tunnel syndrome. -Pennsaid.  Provided sample of Pennsaid. -Counseled on home exercise therapy and supportive care. -If no improvement may need to consider physical therapy, nitro, or an injection.

## 2019-04-19 NOTE — Progress Notes (Signed)
Medication Samples have been provided to the patient.  Drug name: Pennsaid     Strength: 2%       Qty: 1 Box  LOT: [id number]  Exp.Date: 11/2019  Dosing instructions: Use a peasize amount and rub gently.  The patient has been instructed regarding the correct time, dose, and frequency of taking this medication, including desired effects and most common side effects.   Sherrie George, [place] 1:03 PM 04/19/2019

## 2019-04-19 NOTE — Assessment & Plan Note (Signed)
Likely related to her work environment while working at home on [employer].  Most consistent with medial epicondylitis.  Does not appear to be cubital tunnel syndrome. -Pennsaid.  Provided sample of Pennsaid. -Counseled on home exercise therapy and supportive care. -If no improvement may need to consider physical therapy, nitro, or an injection.

## 2019-05-17 ENCOUNTER — Ambulatory Visit: Payer: BC Managed Care – PPO | Admitting: Family Medicine

## 2019-05-26 ENCOUNTER — Encounter: Payer: Self-pay | Admitting: Family Medicine

## 2019-05-26 NOTE — Telephone Encounter (Signed)
Not a pt of Dr. Lupita Dawn or any one in this office.

## 2019-05-26 NOTE — Telephone Encounter (Signed)
Pt stated that rx metoprolol has caused swelling at home. She would like to know what Dr. Derrel Nip would recommend next. Requesting return call.

## 2019-05-26 NOTE — Telephone Encounter (Signed)
Called her- LMOM.  I think this med was started by Percell Miller last month.  She had swelling with amlodipine- ankle swelling.  Is this what she is experiencing now?  If any lip or tongue swelling do not take the medication any longer. Will send her a mychart message

## 2019-05-26 NOTE — Telephone Encounter (Signed)
Sent to [facility] in error.  Dr. Lillie Fragmin pt. Please advise.  Pt stated that rx metoprolol has caused swelling at home. She would like to know what Dr. Derrel Nip would recommend next. Requesting return call.

## 2019-05-27 NOTE — Telephone Encounter (Signed)
Called her back - she got swelling in her legs and ankles again  She started metoprolol maybe 6 weeks ago She started having some swelling in her legs about a week ago She is sitting a lot doing her work from home  No SOB or weight gain, no orthopnea The swelling gets worse over the day, goes down over night I suggested that she wear compression socks and take a brisk walk at least twice a day- This should help with swelling Advised that metoprolol does not generally cause swelling, but of course cannot completley rule it out as the cause She will let me know how she does

## 2019-07-08 ENCOUNTER — Other Ambulatory Visit: Payer: Self-pay | Admitting: Medical

## 2019-07-13 ENCOUNTER — Other Ambulatory Visit: Payer: Self-pay | Admitting: Family Medicine

## 2019-07-13 NOTE — Telephone Encounter (Signed)
Patient would like PCP to know she's completly out of famotidine (PEPCID) 40 MG tablet , informed patient in the future please allow 48 to 72 hour turn around time

## 2019-09-11 ENCOUNTER — Telehealth: Payer: Self-pay

## 2019-09-11 MED ORDER — LORAZEPAM 0.5 MG PO TABS: ORAL_TABLET | ORAL | 0 refills | Status: DC

## 2019-09-11 NOTE — Telephone Encounter (Signed)
Today is Monday- her 51 year old son Caedyn Tassinari died this past 14-Feb-2023 due to complications a couple of weeks after being shot.  She is doing as well as can be expected, having a hard time sleeping at night and would like medication to use on a temporary basis Expressed my deepest condolences She will use ativan as needed for sleep - cautioned regarding dependence

## 2019-09-11 NOTE — Telephone Encounter (Signed)
Copied from [place] [id number]. Topic: General - Inquiry >> Sep 11, 2019  9:59 AM Percell Belt A wrote: Reason for CRM: pt called in and stated that he lost her son on Friday and I having trouble sleeping.  She would like to know what she would recommend for her to take . She did not want to make an appt right now  Best number  [phone] Pharmacy -[id number] - , [place] [phone] (Phone)

## 2019-10-10 ENCOUNTER — Other Ambulatory Visit: Payer: Self-pay | Admitting: Family Medicine

## 2019-10-10 DIAGNOSIS — Z5181 Encounter for therapeutic drug level monitoring: Secondary | ICD-10-CM

## 2019-10-26 ENCOUNTER — Other Ambulatory Visit: Payer: Self-pay | Admitting: Family Medicine

## 2019-10-26 NOTE — Telephone Encounter (Signed)
Requesting:Lorazepam Contract:04/27/2019 NOM:VEHM Last Visit:04/12/2019 Next Visit:none Last Refill:09/11/2019  Please Advise

## 2019-12-18 ENCOUNTER — Other Ambulatory Visit: Payer: Self-pay | Admitting: Family Medicine

## 2019-12-28 ENCOUNTER — Ambulatory Visit: Payer: Self-pay | Attending: Family

## 2019-12-28 DIAGNOSIS — Z23 Encounter for immunization: Secondary | ICD-10-CM

## 2019-12-28 NOTE — Progress Notes (Signed)
   Covid-19 Vaccination Clinic  Name:  Angela Wall    MRN: [id number] DOB: 10/02/1966  12/28/2019  [name] was observed post Covid-19 immunization for 15 minutes without incident. She was provided with Vaccine Information Sheet and instruction to access the V-Safe system.   [name] was instructed to call 911 with any severe reactions post vaccine: Marland Kitchen Difficulty breathing  . Swelling of face and throat  . A fast heartbeat  . A bad rash all over body  . Dizziness and weakness   Immunizations Administered    Name Date Dose VIS Date Route   Moderna COVID-19 Vaccine 12/28/2019 11:26 AM 0.5 mL 09/26/2019 Intramuscular   Manufacturer: Moderna   Lot: [id number]   NDC: 62563-893-73

## 2020-01-03 ENCOUNTER — Other Ambulatory Visit: Payer: Self-pay | Admitting: Family Medicine

## 2020-01-05 ENCOUNTER — Other Ambulatory Visit: Payer: Self-pay | Admitting: Family Medicine

## 2020-01-18 ENCOUNTER — Other Ambulatory Visit: Payer: Self-pay | Admitting: Family Medicine

## 2020-01-30 ENCOUNTER — Ambulatory Visit: Payer: Self-pay | Attending: Family

## 2020-01-30 DIAGNOSIS — Z23 Encounter for immunization: Secondary | ICD-10-CM

## 2020-01-30 NOTE — Progress Notes (Signed)
   Covid-19 Vaccination Clinic  Name:  ENMA MAEDA    MRN: [id number] DOB: July 10, 1966  01/30/2020  Ms. Dillehay was observed post Covid-19 immunization for 15 minutes without incident. She was provided with Vaccine Information Sheet and instruction to access the V-Safe system.   Ms. Manso was instructed to call 911 with any severe reactions post vaccine: Marland Kitchen Difficulty breathing  . Swelling of face and throat  . A fast heartbeat  . A bad rash all over body  . Dizziness and weakness   Immunizations Administered    Name Date Dose VIS Date Route   Moderna COVID-19 Vaccine 01/30/2020  1:33 PM 0.5 mL 09/26/2019 Intramuscular   Manufacturer: Moderna   Lot: [id number]   NDC: 41324-401-02

## 2020-03-14 ENCOUNTER — Other Ambulatory Visit: Payer: Self-pay | Admitting: Family Medicine

## 2020-03-17 IMAGING — US US EXTREM LOW VENOUS BILAT
1 series · 13 of 24 positions shown · non-contrast
Comparison: None.

CLINICAL DATA: 51-year-old female with bilateral lower extremity
swelling for the past 3 days



[Series 1: us extrem low venous bilat · 0.09mm/px · 13 of 46 slices shown]
[im 1/46]
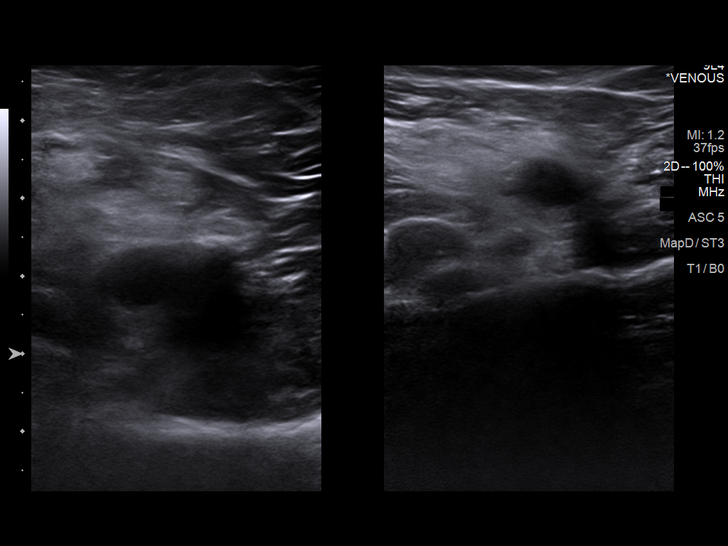
[im 4/46]
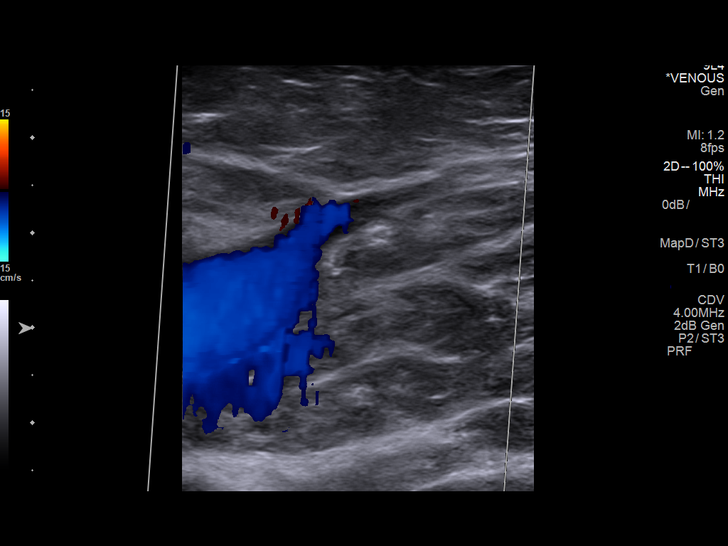
[im 8/46]
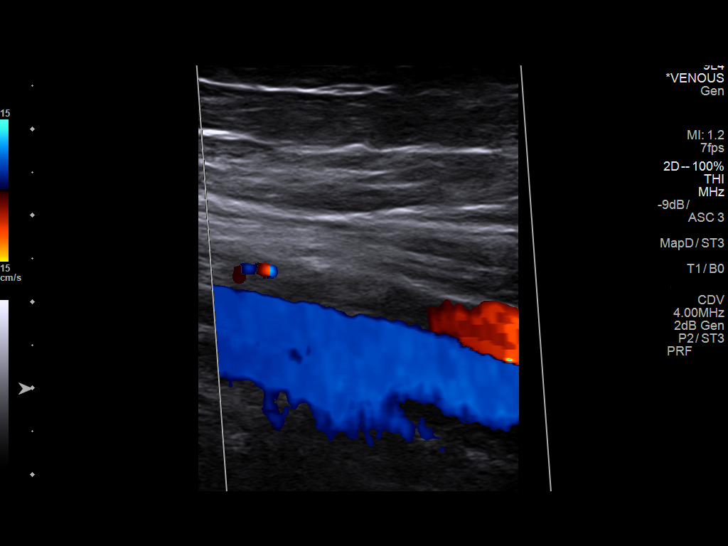
[im 12/46]
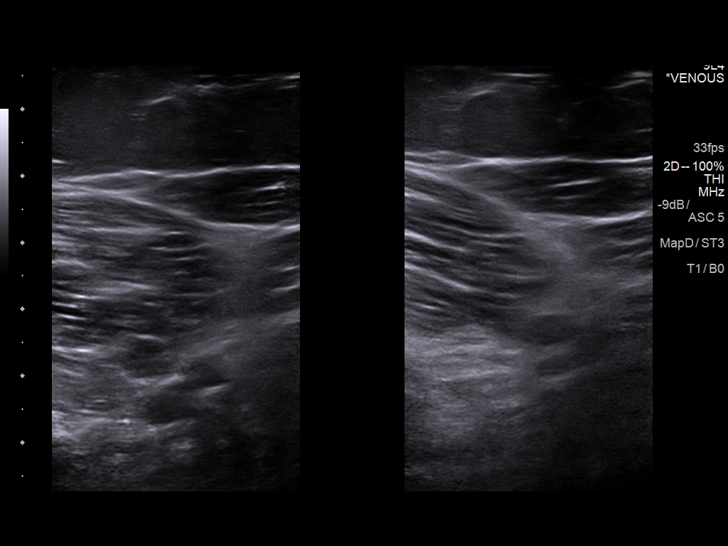
[im 16/46]
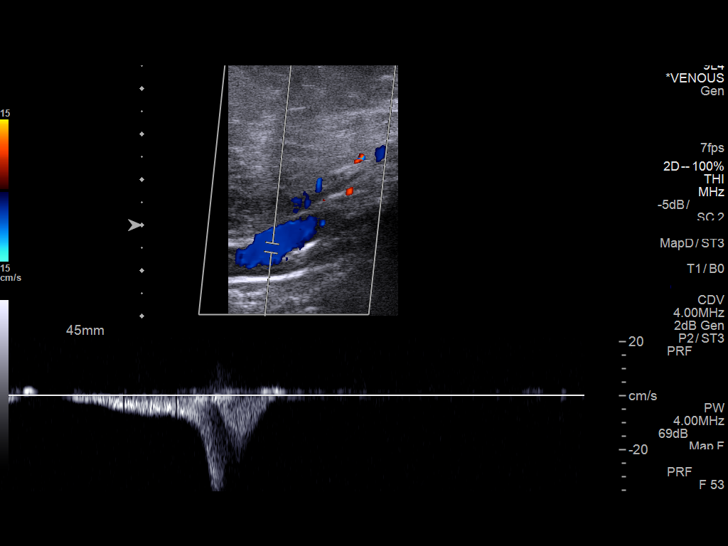
[im 20/46]
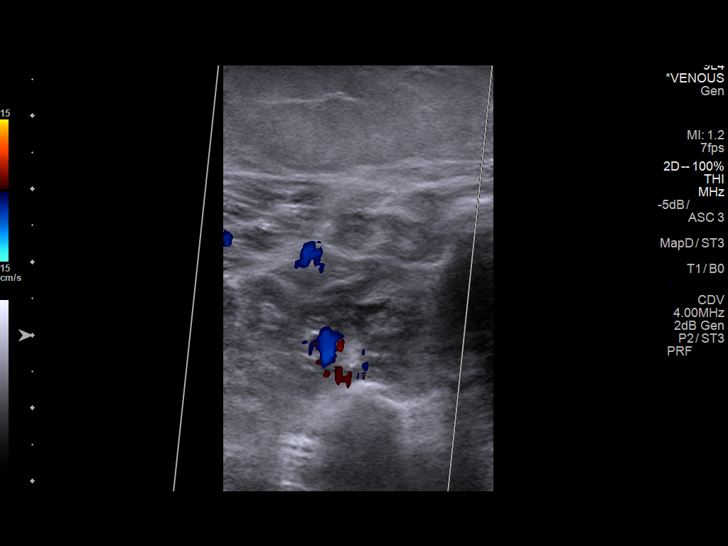
[im 24/46]
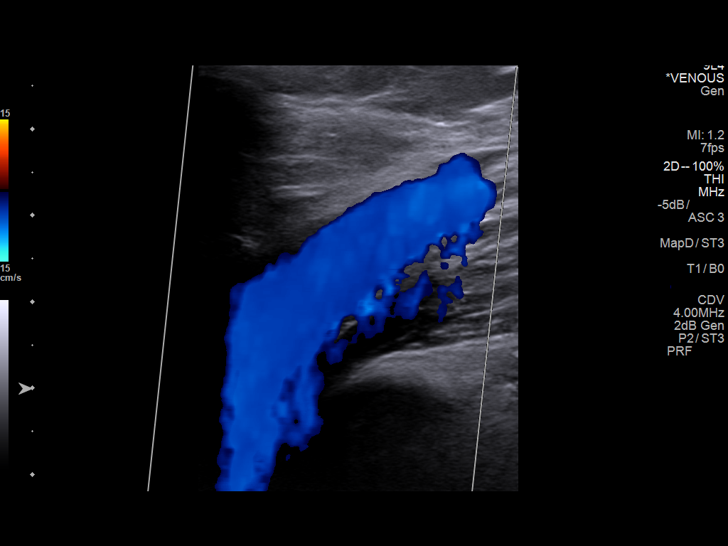
[im 26/46]
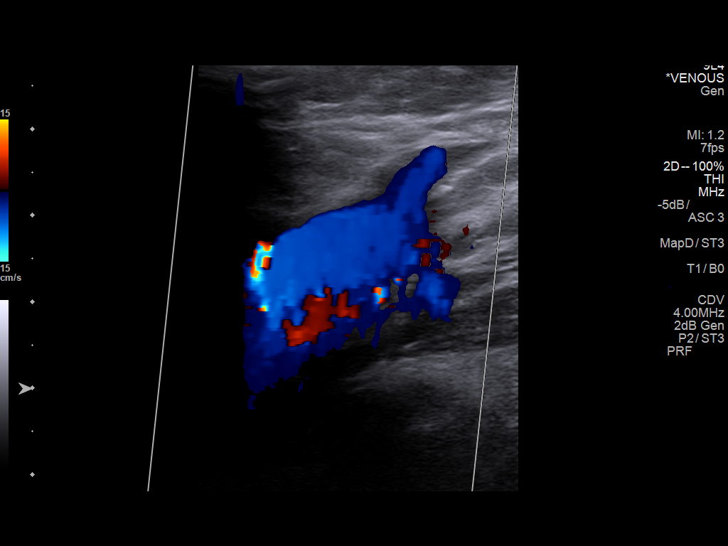
[im 30/46]
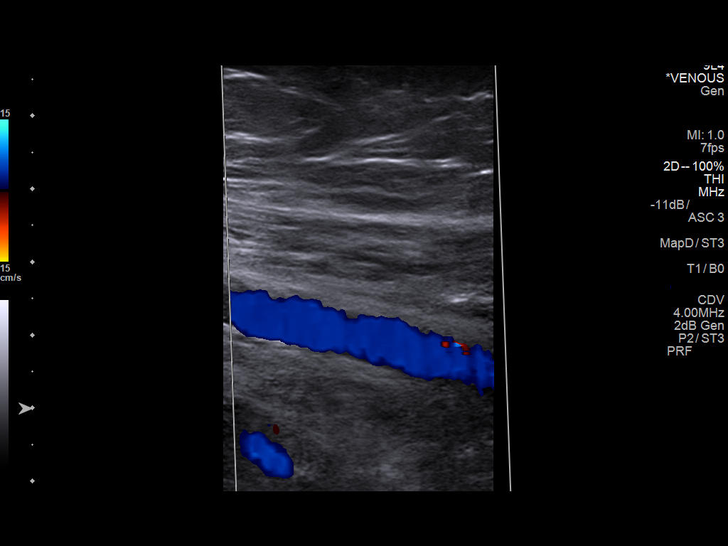
[im 34/46]
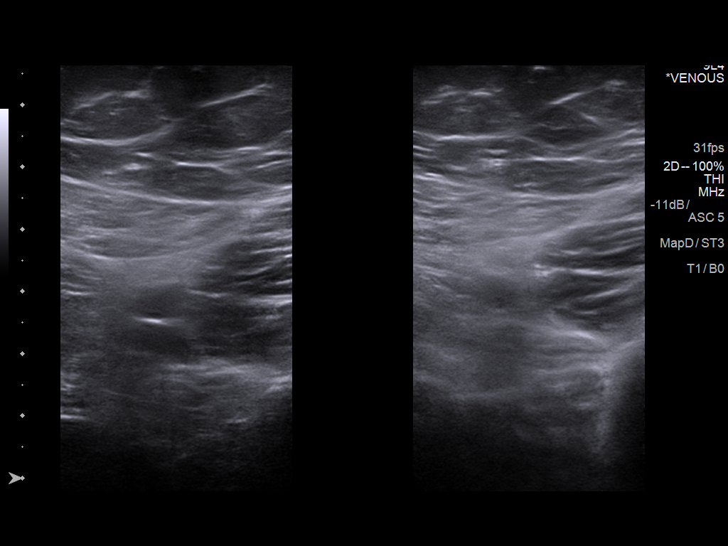
[im 38/46]
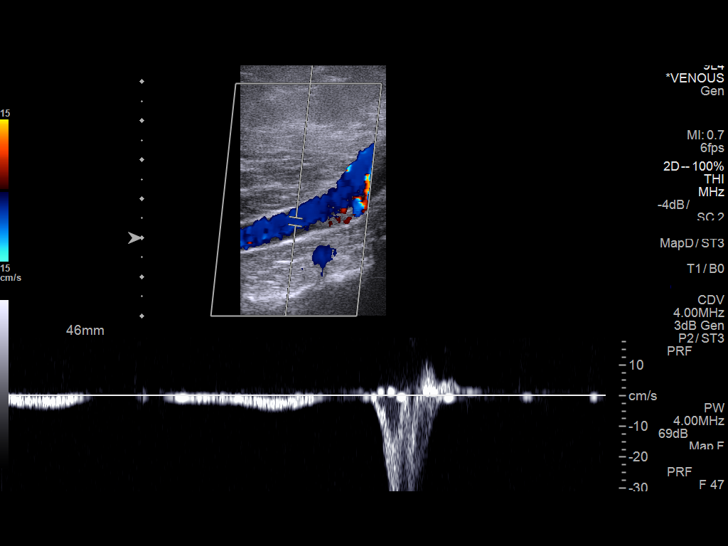
[im 42/46]
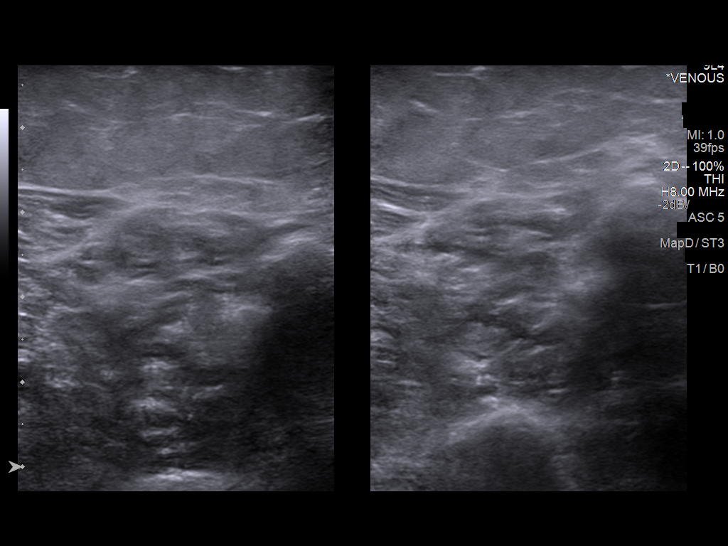
[im 46/46]
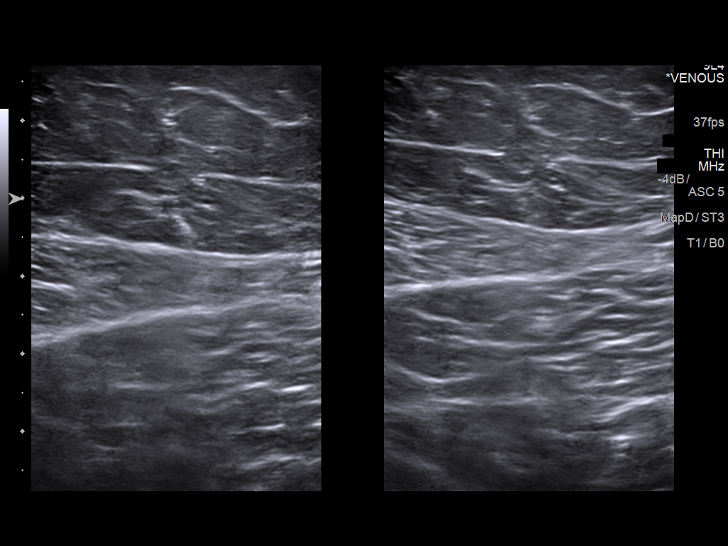

[13 of 24 positions shown; findings below may reference images not displayed]

FINDINGS: RIGHT LOWER EXTREMITY

Common Femoral Vein: No evidence of thrombus. Normal
compressibility, respiratory phasicity and response to augmentation.

Saphenofemoral Junction: No evidence of thrombus. Normal
compressibility and flow on color Doppler imaging.

Profunda Femoral Vein: No evidence of thrombus. Normal
compressibility and flow on color Doppler imaging.

Femoral Vein: No evidence of thrombus. Normal compressibility,
respiratory phasicity and response to augmentation.

Popliteal Vein: No evidence of thrombus. Normal compressibility,
respiratory phasicity and response to augmentation.

Calf Veins: No evidence of thrombus. Normal compressibility and flow
on color Doppler imaging.

Superficial Great Saphenous Vein: No evidence of thrombus. Normal
compressibility.

Venous Reflux:  None.

Other Findings:  None.

LEFT LOWER EXTREMITY

Common Femoral Vein: No evidence of thrombus. Normal
compressibility, respiratory phasicity and response to augmentation.

Saphenofemoral Junction: No evidence of thrombus. Normal
compressibility and flow on color Doppler imaging.

Profunda Femoral Vein: No evidence of thrombus. Normal
compressibility and flow on color Doppler imaging.

Femoral Vein: No evidence of thrombus. Normal compressibility,
respiratory phasicity and response to augmentation.

Popliteal Vein: No evidence of thrombus. Normal compressibility,
respiratory phasicity and response to augmentation.

Calf Veins: No evidence of thrombus. Normal compressibility and flow
on color Doppler imaging.

Superficial Great Saphenous Vein: No evidence of thrombus. Normal
compressibility.

Venous Reflux:  None.

Other Findings:  None.
IMPRESSION: No evidence of deep venous thrombosis.

## 2020-04-06 LAB — HM PAP SMEAR
HM Pap smear: NEGATIVE
HPV, high-risk: NEGATIVE

## 2020-05-17 ENCOUNTER — Ambulatory Visit: Payer: BC Managed Care – PPO | Admitting: Medical

## 2020-05-17 ENCOUNTER — Other Ambulatory Visit: Payer: Self-pay

## 2020-05-17 VITALS — BP 132/74 | HR 80 | Temp 98.2°F | Resp 18 | Ht 60.0 in | Wt 162.2 lb

## 2020-05-17 DIAGNOSIS — H9202 Otalgia, left ear: Secondary | ICD-10-CM

## 2020-05-17 DIAGNOSIS — J309 Allergic rhinitis, unspecified: Secondary | ICD-10-CM | POA: Diagnosis not present

## 2020-05-17 DIAGNOSIS — H6092 Unspecified otitis externa, left ear: Secondary | ICD-10-CM

## 2020-05-17 MED ORDER — AZITHROMYCIN 250 MG PO TABS: ORAL_TABLET | ORAL | 0 refills | Status: DC

## 2020-05-17 MED ORDER — NEOMYCIN-POLYMYXIN-HC 3.5-10000-1 OT SOLN: 3.0000 [drp] | Freq: Four times a day (QID) | OTIC | 0 refills | Status: DC

## 2020-05-17 MED ORDER — FLUTICASONE PROPIONATE 50 MCG/ACT NA SUSP: 2.0000 | Freq: Every day | NASAL | 2 refills | Status: DC

## 2020-05-17 NOTE — Progress Notes (Signed)
Subjective:    Patient ID: Angela Wall, female    DOB: 1966/05/26, 54 y.o.   MRN: [id number]  HPI  Pt in with 2 weeks of ear pain discomfort. Pt has pain in both ears. Left side is worse. Mild nasal congestion on and off. 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 Dust in building. Some sneezing.   No fever, no chills or sweats. Faint mild sinus pain.   Pt thought initially ear buds were bothering her.  Pt has not been swimming.    Review of Systems  Constitutional: Negative for chills, fatigue and fever.  HENT: Positive for congestion, ear pain and sinus pressure. Negative for sinus pain.   Respiratory: Negative for chest tightness, shortness of breath and wheezing.   Cardiovascular: Negative for chest pain and palpitations.  Gastrointestinal: Negative for abdominal pain.  Musculoskeletal: Negative for back pain.  Skin: Negative for rash.  Neurological: Negative for dizziness and light-headedness.  Hematological: Negative for adenopathy. Does not bruise/bleed easily.  Psychiatric/Behavioral: Negative for behavioral problems and decreased concentration.    Past Medical History:  Diagnosis Date  . Abnormal bruising 12/09/2015  . Breast pain, left 05/23/2015  . Cervical spondylosis with myelopathy 03/19/2010   Qualifier: Diagnosis of  By: Yetta Barre MD, Bernadene Bell.   . Edema 05/23/2015  . Essential hypertension 07/05/2009   Qualifier: Diagnosis of  By: Yetta Barre MD, Bernadene Bell.   Marland Kitchen GERD 07/08/2009   Qualifier: Diagnosis of  By: Yetta Barre MD, Bernadene Bell.   Marland Kitchen GERD (gastroesophageal reflux disease)   . Hypertension   . Insomnia 06/05/2014  . Knee pain, left 06/05/2014  . LACTOSE INTOLERANCE 12/31/2009   Qualifier: Diagnosis of  By: Jarold Motto MD Lang Snow   . Other abnormal glucose 03/14/2012  . Pain in joint involving right ankle and foot 11/06/2015  . Pain in joint, shoulder region 06/05/2014  . Pure hypercholesterolemia 03/14/2012  . SPINAL STENOSIS, CERVICAL 03/27/2010   Qualifier:  Diagnosis of  By: Yetta Barre MD, Bernadene Bell.      Social History   Socioeconomic History  . Marital status: Divorced    Spouse name: Not on file  . Number of children: Not on file  . Years of education: Not on file  . Highest education level: Not on file  Occupational History  . Not on file  Tobacco Use  . Smoking status: Never Smoker  . Smokeless tobacco: Never Used  Vaping Use  . Vaping Use: Never used  Substance and Sexual Activity  . Alcohol use: Yes    Comment: social drinker  . Drug use: No  . Sexual activity: Yes    Birth control/protection: Surgical  Other Topics Concern  . Not on file  Social History Narrative  . Not on file   Social Determinants of Health   Financial Resource Strain:   . Difficulty of Paying Living Expenses:   Food Insecurity:   . Worried About Programme researcher, broadcasting/film/video in the Last Year:   . Barista in the Last Year:   Transportation Needs:   . Freight forwarder (Medical):   Marland Kitchen Lack of Transportation (Non-Medical):   Physical Activity:   . Days of Exercise per Week:   . Minutes of Exercise per Session:   Stress:   . Feeling of Stress :   Social Connections:   . Frequency of Communication with Friends and Family:   . Frequency of Social Gatherings with Friends and Family:   . Attends  Religious Services:   . Active Member of Clubs or Organizations:   . Attends Banker Meetings:   Marland Kitchen Marital Status:   Intimate Partner Violence:   . Fear of Current or Ex-Partner:   . Emotionally Abused:   Marland Kitchen Physically Abused:   . Sexually Abused:     Past Surgical History:  Procedure Laterality Date  . BTL post-bilateral ectopics    . ENDOMETRIAL ABLATION     ? Novasure  . TUBAL LIGATION      Family History  Problem Relation Age of Onset  . Cancer Mother        Breast cancer <50  . Kidney disease Maternal Uncle   . Stroke Maternal Uncle   . Hypertension Other   . Heart attack Paternal Grandmother     Allergies  Allergen  Reactions  . Oxycodone-Acetaminophen Itching  . Oxycodone-Acetaminophen     REACTION: Itching  . Propoxyphene N-Acetaminophen     REACTION: Itching    Current Outpatient Medications on File Prior to Visit  Medication Sig Dispense Refill  . Biotin 40981 MCG TABS Take by mouth.    . famotidine (PEPCID) 40 MG tablet TAKE 1 TABLET (40 MG TOTAL) BY MOUTH DAILY. USE AS NEEDED FOR GERD 90 tablet 1  . furosemide (LASIX) 20 MG tablet TAKE 2 TABLETS BY MOUTH EVERY DAY 180 tablet 1  . levocetirizine (XYZAL) 5 MG tablet TAKE 1 TABLET BY MOUTH EVERY DAY IN THE EVENING 30 tablet 2  . metoprolol succinate (TOPROL-XL) 50 MG 24 hr tablet TAKE 1 TABLET (50 MG TOTAL) BY MOUTH DAILY. TAKE WITH OR IMMEDIATELY FOLLOWING A MEAL. 90 tablet 0  . olmesartan (BENICAR) 40 MG tablet TAKE 1 TABLET BY MOUTH EVERY DAY 90 tablet 1  . Diclofenac Sodium (PENNSAID) 2 % SOLN Place 1 application onto the skin 2 (two) times daily. (Patient not taking: Reported on 05/17/2020) 112 g 3  . LORazepam (ATIVAN) 0.5 MG tablet TAKE 1 OR 2 AT BEDTIME AS NEEDED FOR SLEEP (Patient not taking: Reported on 05/17/2020) 30 tablet 0   No current facility-administered medications on file prior to visit.    BP (!) 132/74   Pulse 80   Temp 98.2 F (36.8 C) (Oral)   Resp 18   Ht 5' (1.524 m)   Wt 162 lb 3.2 oz (73.6 kg)   SpO2 98%   BMI 31.68 kg/m       Objective:   Physical Exam  General  Mental Status - Alert. General Appearance - Well groomed. Not in acute distress.  Skin Rashes- No Rashes.  HEENT Head- Normal. Ear Auditory Canal - Left- mild tender upper canal when insert otoscopy(mild canal swelling). Right - Normal.Tympanic Membrane- Left- mild pink l. Right- Normal. Eye Sclera/Conjunctiva- Left- Normal. Right- Normal. Nose & Sinuses Nasal Mucosa- Left-  Boggy and Congested. Right-  Boggy and  Congested.Bilateral  No maxillary and no  frontal sinus pressure. Mouth & Throat Lips: Upper Lip- Normal: no dryness, cracking,  pallor, cyanosis, or vesicular eruption. Lower Lip-Normal: no dryness, cracking, pallor, cyanosis or vesicular eruption. Buccal Mucosa- Bilateral- No Aphthous ulcers. Oropharynx- No Discharge or Erythema. Tonsils: Characteristics- Bilateral- No Erythema or Congestion. Size/Enlargement- Bilateral- No enlargement. Discharge- bilateral-None.  Neck Neck- Supple. No Masses.   Chest and Lung Exam Auscultation: Breath Sounds:-Clear even and unlabored.  Cardiovascular Auscultation:Rythm- Regular, rate and rhythm. Murmurs & Other Heart Sounds:Ausculatation of the heart reveal- No Murmurs.  Lymphatic Head & Neck General Head & Neck Lymphatics: Bilateral: Description-  No Localized lymphadenopathy.       Assessment & Plan:  You do appear to have otitis externa left ear canal. Also possible recent allergic rhinitis after dust exposure.   Will rx cortisporin otic drops and flonase nasal spray.  If you have any deep left ear pain over the weekend start azithromycin antibiotic. Print rx given.  Follow up in 7-10 days or as needed  Esperanza Richters, PA-C   Time spent with patient today was 20  minutes which consisted of chart review, discussing diagnosis treatment and documentation.

## 2020-05-17 NOTE — Patient Instructions (Signed)
You do appear to have otitis externa left ear canal. Also possible recent allergic rhinitis after dust exposure.   Will rx cortisporin otic drops and flonase nasal spray.  If you have any deep left ear pain over the weekend start azithromycin antibiotic. Print rx given.  Follow up in 7-10 days or as needed

## 2020-07-02 ENCOUNTER — Other Ambulatory Visit: Payer: Self-pay | Admitting: Family Medicine

## 2020-07-06 ENCOUNTER — Other Ambulatory Visit: Payer: Self-pay | Admitting: Medical

## 2020-07-06 ENCOUNTER — Other Ambulatory Visit: Payer: Self-pay | Admitting: Family Medicine

## 2020-07-15 ENCOUNTER — Other Ambulatory Visit: Payer: Self-pay | Admitting: Family Medicine

## 2020-07-31 ENCOUNTER — Other Ambulatory Visit: Payer: Self-pay | Admitting: Family Medicine

## 2020-08-26 ENCOUNTER — Other Ambulatory Visit: Payer: Self-pay

## 2020-08-26 ENCOUNTER — Telehealth (INDEPENDENT_AMBULATORY_CARE_PROVIDER_SITE_OTHER): Payer: BC Managed Care – PPO | Admitting: Family Medicine

## 2020-08-26 DIAGNOSIS — E7800 Pure hypercholesterolemia, unspecified: Secondary | ICD-10-CM

## 2020-08-26 DIAGNOSIS — F5102 Adjustment insomnia: Secondary | ICD-10-CM

## 2020-08-26 DIAGNOSIS — I10 Essential (primary) hypertension: Secondary | ICD-10-CM

## 2020-08-26 DIAGNOSIS — Z5181 Encounter for therapeutic drug level monitoring: Secondary | ICD-10-CM | POA: Diagnosis not present

## 2020-08-26 DIAGNOSIS — Z131 Encounter for screening for diabetes mellitus: Secondary | ICD-10-CM

## 2020-08-26 DIAGNOSIS — Z1211 Encounter for screening for malignant neoplasm of colon: Secondary | ICD-10-CM

## 2020-08-26 DIAGNOSIS — Z130 Encounter for screening for diseases of the blood and blood-forming organs and certain disorders involving the immune mechanism: Secondary | ICD-10-CM | POA: Diagnosis not present

## 2020-08-26 DIAGNOSIS — Z1329 Encounter for screening for other suspected endocrine disorder: Secondary | ICD-10-CM

## 2020-08-26 DIAGNOSIS — Z1159 Encounter for screening for other viral diseases: Secondary | ICD-10-CM

## 2020-08-26 MED ORDER — FUROSEMIDE 20 MG PO TABS: 40.0000 mg | ORAL_TABLET | Freq: Every day | ORAL | 1 refills | Status: DC

## 2020-08-26 MED ORDER — FAMOTIDINE 40 MG PO TABS: 40.0000 mg | ORAL_TABLET | Freq: Every day | ORAL | 1 refills | Status: DC

## 2020-08-26 MED ORDER — LORAZEPAM 0.5 MG PO TABS: ORAL_TABLET | ORAL | 0 refills | Status: AC

## 2020-08-26 MED ORDER — OLMESARTAN MEDOXOMIL 40 MG PO TABS: 40.0000 mg | ORAL_TABLET | Freq: Every day | ORAL | 3 refills | Status: DC

## 2020-08-26 MED ORDER — METOPROLOL SUCCINATE ER 50 MG PO TB24: 50.0000 mg | ORAL_TABLET | Freq: Every day | ORAL | 1 refills | Status: DC

## 2020-08-26 NOTE — Progress Notes (Signed)
[facility] [address], [address] [place], [place] [address] [phone]  Date:  08/26/2020   Name:  Angela Wall   DOB:  1966-10-13   MRN:  [id number]  PCP:  Pearline Cables, MD    Chief Complaint: No chief complaint on file.   History of Present Illness:  Angela Wall is a 54 y.o. very pleasant female patient who presents with the following:  Virtual visit today- pt is at home, provider is at office  Connect with patient via video monitor.  The patient myself on the call today.  Patient identity is confirmed with 2 factors-general checkup, follow-up visit today  Tragically, she lost her son last November. She is coming up on a year since she lost him- he was shot and killed. This crime is still unsolved.  She and her aunt joined a support group, and they are working with Mothers against gun violence Last in person visit was 10/27/2018 Her partner Vonna Kotyk, her aunt, her co-workers and friends are supporting her  She is working, same job and all the same there  Pt notes that her sleep and appetite are ok She does not feel that she is depressed, although of course she is mourning the loss of her son  She gets her mammo through her GYN- she is UTD per her report Her mother had breast cancer so she is careful to keep up with this  She did get genetic testing and was negative  She did a colonoscopy 10 years ago- it was normal  Would like to do cologaurd  Flu shot is due- encouraged her to do this She did her covid 19 series   10/26/2019  10/26/2019   1  Lorazepam 0.5 Mg Tablet 30.00  15  Je Cop  [id number]  Nor ([id number])  0/0  1.00 LME  Comm Ins  [place]    09/11/2019  09/11/2019   1  Lorazepam 0.5 Mg Tablet 30.00  15  Je Cop  [id number]  [place]        Pt is thinking of working for [employer] -she may have some paperwork for me to fill out which is fine   Patient Active Problem List   Diagnosis Date Noted  . Medial  epicondylitis of right elbow 04/19/2019  . Plantar fasciitis, left 05/24/2018  . Tendonitis, Achilles, right 05/24/2018  . SOB (shortness of breath) 03/07/2018  . Abnormal bruising 12/09/2015  . Breast pain, left 05/23/2015  . Edema, lower extremity 05/23/2015  . Pain in joint, shoulder region 06/05/2014  . Knee pain, left 06/05/2014  . Insomnia 06/05/2014  . Other abnormal glucose 03/14/2012  . Pure hypercholesterolemia 03/14/2012  . SPINAL STENOSIS, CERVICAL 03/27/2010  . Cervical spondylosis with myelopathy 03/19/2010  . LACTOSE INTOLERANCE 12/31/2009  . GERD 07/08/2009  . Essential hypertension 07/05/2009    Past Medical History:  Diagnosis Date  . Abnormal bruising 12/09/2015  . Breast pain, left 05/23/2015  . Cervical spondylosis with myelopathy 03/19/2010   Qualifier: Diagnosis of  By: Yetta Barre MD, Bernadene Bell.   . Edema 05/23/2015  . Essential hypertension 07/05/2009   Qualifier: Diagnosis of  By: Yetta Barre MD, Bernadene Bell.   Marland Kitchen GERD 07/08/2009   Qualifier: Diagnosis of  By: Yetta Barre MD, Bernadene Bell.   Marland Kitchen GERD (gastroesophageal reflux disease)   . Hypertension   . Insomnia 06/05/2014  . Knee pain, left 06/05/2014  . LACTOSE INTOLERANCE 12/31/2009  Qualifier: Diagnosis of  By: Jarold Motto MD Lang Snow Other abnormal glucose 03/14/2012  . Pain in joint involving right ankle and foot 11/06/2015  . Pain in joint, shoulder region 06/05/2014  . Pure hypercholesterolemia 03/14/2012  . SPINAL STENOSIS, CERVICAL 03/27/2010   Qualifier: Diagnosis of  By: Yetta Barre MD, Bernadene Bell.     Past Surgical History:  Procedure Laterality Date  . BTL post-bilateral ectopics    . ENDOMETRIAL ABLATION     ? Novasure  . TUBAL LIGATION      Social History   Tobacco Use  . Smoking status: Never Smoker  . Smokeless tobacco: Never Used  Vaping Use  . Vaping Use: Never used  Substance Use Topics  . Alcohol use: Yes    Comment: social drinker  . Drug use: No    Family History  Problem Relation Age of Onset   . Cancer Mother        Breast cancer <50  . Kidney disease Maternal Uncle   . Stroke Maternal Uncle   . Hypertension Other   . Heart attack Paternal Grandmother     Allergies  Allergen Reactions  . Oxycodone-Acetaminophen Itching  . Oxycodone-Acetaminophen     REACTION: Itching  . Propoxyphene N-Acetaminophen     REACTION: Itching    Medication list has been reviewed and updated.  Current Outpatient Medications on File Prior to Visit  Medication Sig Dispense Refill  . Biotin 95093 MCG TABS Take by mouth.    . Diclofenac Sodium (PENNSAID) 2 % SOLN Place 1 application onto the skin 2 (two) times daily. (Patient not taking: Reported on 05/17/2020) 112 g 3  . fluticasone (FLONASE) 50 MCG/ACT nasal spray SPRAY 2 SPRAYS INTO EACH NOSTRIL EVERY DAY 48 mL 0  . levocetirizine (XYZAL) 5 MG tablet TAKE 1 TABLET BY MOUTH EVERY DAY IN THE EVENING 30 tablet 2  . neomycin-polymyxin-hydrocortisone (CORTISPORIN) OTIC solution Place 3 drops into the left ear 4 (four) times daily. 10 mL 0   No current facility-administered medications on file prior to visit.    Review of Systems:  As per HPI- otherwise negative.   Physical Examination: There were no vitals filed for this visit. There were no vitals filed for this visit. There is no height or weight on file to calculate BMI. Ideal Body Weight:    Pt observed via video monitor.  She looks well, her normal self.  No distress or shortness of breath is noted  BP 150/89 today Pulse 77    BP Readings from Last 3 Encounters:  05/17/20 (!) 132/74  04/19/19 128/85  04/12/19 (!) 161/103     Assessment and Plan: Pure hypercholesterolemia - Plan: Lipid panel  Medication monitoring encounter - Plan: furosemide (LASIX) 20 MG tablet  Screening for diabetes mellitus - Plan: Comprehensive metabolic panel, Hemoglobin A1c  Screening for deficiency anemia - Plan: CBC  Essential hypertension - Plan: metoprolol succinate (TOPROL-XL) 50 MG 24 hr  tablet, olmesartan (BENICAR) 40 MG tablet, famotidine (PEPCID) 40 MG tablet  Encounter for hepatitis C screening test for low risk patient - Plan: Hepatitis C antibody  Screening for thyroid disorder - Plan: TSH  Adjustment insomnia - Plan: LORazepam (ATIVAN) 0.5 MG tablet  Screening for colon cancer  Virtual visit today for follow-up.  Patient lost her adult son almost 1 year ago to gun violence.  Overall, she is handling it as well as could be expected.  I will refill her lorazepam which she uses as needed  for anxiety/insomnia  Blood pressure has been recently well controlled, somewhat high at check earlier today.  We will continue her current medications, plan to get her in for a lab visit as soon as possible.  I also encouraged her to get a flu shot Order Cologuard to screen for colon cancer Will plan further follow- up pending labs.   Signed Abbe Amsterdam, MD

## 2020-08-27 NOTE — Progress Notes (Signed)
Cologuard ordered for patient.  

## 2020-08-30 ENCOUNTER — Encounter: Payer: Self-pay | Admitting: Family Medicine

## 2020-08-30 DIAGNOSIS — Z634 Disappearance and death of family member: Secondary | ICD-10-CM

## 2020-08-30 DIAGNOSIS — F4321 Adjustment disorder with depressed mood: Secondary | ICD-10-CM

## 2020-09-01 DIAGNOSIS — Z634 Disappearance and death of family member: Secondary | ICD-10-CM | POA: Insufficient documentation

## 2020-09-01 DIAGNOSIS — F4321 Adjustment disorder with depressed mood: Secondary | ICD-10-CM | POA: Insufficient documentation

## 2020-09-02 NOTE — Addendum Note (Signed)
Addended by: Mervin Kung A on: 09/02/2020 03:34 PM   Modules accepted: Orders

## 2020-09-05 ENCOUNTER — Other Ambulatory Visit: Payer: Self-pay

## 2020-09-05 ENCOUNTER — Other Ambulatory Visit (INDEPENDENT_AMBULATORY_CARE_PROVIDER_SITE_OTHER): Payer: BC Managed Care – PPO

## 2020-09-05 DIAGNOSIS — Z131 Encounter for screening for diabetes mellitus: Secondary | ICD-10-CM | POA: Diagnosis not present

## 2020-09-05 DIAGNOSIS — Z1159 Encounter for screening for other viral diseases: Secondary | ICD-10-CM

## 2020-09-05 DIAGNOSIS — E7800 Pure hypercholesterolemia, unspecified: Secondary | ICD-10-CM | POA: Diagnosis not present

## 2020-09-05 DIAGNOSIS — Z1329 Encounter for screening for other suspected endocrine disorder: Secondary | ICD-10-CM

## 2020-09-05 DIAGNOSIS — R7303 Prediabetes: Secondary | ICD-10-CM

## 2020-09-05 DIAGNOSIS — Z130 Encounter for screening for diseases of the blood and blood-forming organs and certain disorders involving the immune mechanism: Secondary | ICD-10-CM

## 2020-09-06 ENCOUNTER — Encounter: Payer: Self-pay | Admitting: Family Medicine

## 2020-09-06 DIAGNOSIS — R7303 Prediabetes: Secondary | ICD-10-CM | POA: Insufficient documentation

## 2020-09-06 LAB — COMPREHENSIVE METABOLIC PANEL
ALT: 9 U/L (ref 0–35)
AST: 12 U/L (ref 0–37)
Albumin: 4.2 g/dL (ref 3.5–5.2)
Alkaline Phosphatase: 76 U/L (ref 39–117)
BUN: 10 mg/dL (ref 6–23)
CO2: 31 mEq/L (ref 19–32)
Calcium: 9.5 mg/dL (ref 8.4–10.5)
Chloride: 103 mEq/L (ref 96–112)
Creatinine, Ser: 0.79 mg/dL (ref 0.40–1.20)
GFR: 85.98 mL/min (ref >60.00)
Glucose, Bld: 86 mg/dL (ref 70–99)
Potassium: 4.3 mEq/L (ref 3.5–5.1)
Sodium: 139 mEq/L (ref 135–145)
Total Bilirubin: 0.8 mg/dL (ref 0.2–1.2)
Total Protein: 6.9 g/dL (ref 6.0–8.3)

## 2020-09-06 LAB — TSH: TSH: 1.43 u[IU]/mL (ref 0.35–4.50)

## 2020-09-06 LAB — HEMOGLOBIN A1C: Hgb A1c MFr Bld: 6 % (ref 4.6–6.5)

## 2020-09-06 LAB — LIPID PANEL
Cholesterol: 157 mg/dL (ref 0–200)
HDL: 55.3 mg/dL (ref >39.00)
LDL Cholesterol: 85 mg/dL (ref 0–99)
NonHDL: 101.91
Total CHOL/HDL Ratio: 3
Triglycerides: 86 mg/dL (ref 0.0–149.0)
VLDL: 17.2 mg/dL (ref 0.0–40.0)

## 2020-09-06 LAB — CBC
HCT: 40.6 % (ref 36.0–46.0)
Hemoglobin: 13.4 g/dL (ref 12.0–15.0)
MCHC: 33 g/dL (ref 30.0–36.0)
MCV: 87 fl (ref 78.0–100.0)
Platelets: 272 10*3/uL (ref 150.0–400.0)
RBC: 4.67 Mil/uL (ref 3.87–5.11)
RDW: 14.1 % (ref 11.5–15.5)
WBC: 8.2 10*3/uL (ref 4.0–10.5)

## 2020-09-06 LAB — HEPATITIS C ANTIBODY
Hepatitis C Ab: NONREACTIVE
SIGNAL TO CUT-OFF: 0.01 (ref <1.00)

## 2020-09-06 NOTE — Progress Notes (Signed)
Received her labs, message to pt Results for orders placed or performed in visit on 09/05/20  CBC  Result Value Ref Range   WBC 8.2 4.0 - 10.5 K/uL   RBC 4.67 3.87 - 5.11 Mil/uL   Platelets 272.0 150 - 400 K/uL   Hemoglobin 13.4 12.0 - 15.0 g/dL   HCT 78.5 36 - 46 %   MCV 87.0 78.0 - 100.0 fl   MCHC 33.0 30.0 - 36.0 g/dL   RDW 88.5 02.7 - 74.1 %  Comprehensive metabolic panel  Result Value Ref Range   Sodium 139 135 - 145 mEq/L   Potassium 4.3 3.5 - 5.1 mEq/L   Chloride 103 96 - 112 mEq/L   CO2 31 19 - 32 mEq/L   Glucose, Bld 86 70 - 99 mg/dL   BUN 10 6 - 23 mg/dL   Creatinine, Ser 2.87 0.40 - 1.20 mg/dL   Total Bilirubin 0.8 0.2 - 1.2 mg/dL   Alkaline Phosphatase 76 39 - 117 U/L   AST 12 0 - 37 U/L   ALT 9 0 - 35 U/L   Total Protein 6.9 6.0 - 8.3 g/dL   Albumin 4.2 3.5 - 5.2 g/dL   GFR 86.76 >72.09 mL/min   Calcium 9.5 8.4 - 10.5 mg/dL  Hemoglobin O7S  Result Value Ref Range   Hgb A1c MFr Bld 6.0 4.6 - 6.5 %  Lipid panel  Result Value Ref Range   Cholesterol 157 0 - 200 mg/dL   Triglycerides 96.2 0 - 149 mg/dL   HDL 83.66 >29.47 mg/dL   VLDL 65.4 0.0 - 65.0 mg/dL   LDL Cholesterol 85 0 - 99 mg/dL   Total CHOL/HDL Ratio 3    NonHDL 101.91   TSH  Result Value Ref Range   TSH 1.43 0.35 - 4.50 uIU/mL  Hepatitis C antibody  Result Value Ref Range   Hepatitis C Ab NON-REACTIVE NON-REACTI   SIGNAL TO CUT-OFF 0.01 <1.00

## 2020-10-27 ENCOUNTER — Other Ambulatory Visit: Payer: Self-pay | Admitting: Medical

## 2020-11-01 ENCOUNTER — Telehealth: Payer: BC Managed Care – PPO | Admitting: Internal Medicine

## 2020-11-01 ENCOUNTER — Other Ambulatory Visit: Payer: Self-pay

## 2020-11-01 NOTE — Progress Notes (Signed)
Virtual Visit via Video Note  I connected with Angela Wall on 11/01/20 at  2:20 PM EST by a video enabled telemedicine application however she did cancel her appointment same day so visit was not completed.   Myrlene Broker, MD

## 2020-11-21 ENCOUNTER — Encounter: Payer: Self-pay | Admitting: Family Medicine

## 2020-11-30 LAB — COLOGUARD
COLOGUARD: NEGATIVE
Cologuard: NEGATIVE

## 2020-12-02 ENCOUNTER — Encounter: Payer: Self-pay | Admitting: Family Medicine

## 2021-01-02 ENCOUNTER — Ambulatory Visit: Payer: BC Managed Care – PPO | Attending: Family

## 2021-01-02 DIAGNOSIS — Z23 Encounter for immunization: Secondary | ICD-10-CM

## 2021-03-18 ENCOUNTER — Other Ambulatory Visit: Payer: Self-pay | Admitting: Family Medicine

## 2021-03-18 DIAGNOSIS — I10 Essential (primary) hypertension: Secondary | ICD-10-CM

## 2021-04-04 NOTE — Progress Notes (Signed)
   Covid-19 Vaccination Clinic  Name:  [name]    MRN: [id number] DOB: Apr 24, 1966  04/04/2021  [name] was observed post Covid-19 immunization for 15 minutes without incident. She was provided with Vaccine Information Sheet and instruction to access the V-Safe system.   [name] was instructed to call 911 with any severe reactions post vaccine: Difficulty breathing  Swelling of face and throat  A fast heartbeat  A bad rash all over body  Dizziness and weakness   Immunizations Administered     Name Date Dose VIS Date Route   Moderna Covid-19 Booster Vaccine 01/02/2021  9:00 AM 0.25 mL 08/14/2020 Intramuscular   Manufacturer: Moderna   Lot: [id number]   NDC: 45364-680-32

## 2021-04-19 ENCOUNTER — Other Ambulatory Visit: Payer: Self-pay | Admitting: Family Medicine

## 2021-04-19 DIAGNOSIS — I10 Essential (primary) hypertension: Secondary | ICD-10-CM

## 2021-05-16 ENCOUNTER — Other Ambulatory Visit: Payer: Self-pay | Admitting: Family Medicine

## 2021-05-16 DIAGNOSIS — Z5181 Encounter for therapeutic drug level monitoring: Secondary | ICD-10-CM

## 2021-05-26 ENCOUNTER — Other Ambulatory Visit: Payer: Self-pay | Admitting: Family Medicine

## 2021-05-26 DIAGNOSIS — I10 Essential (primary) hypertension: Secondary | ICD-10-CM

## 2021-05-30 ENCOUNTER — Other Ambulatory Visit: Payer: Self-pay | Admitting: Family Medicine

## 2021-05-30 DIAGNOSIS — Z5181 Encounter for therapeutic drug level monitoring: Secondary | ICD-10-CM

## 2021-09-22 ENCOUNTER — Other Ambulatory Visit: Payer: Self-pay | Admitting: Family Medicine

## 2021-09-22 DIAGNOSIS — I10 Essential (primary) hypertension: Secondary | ICD-10-CM

## 2021-09-30 ENCOUNTER — Encounter: Payer: Self-pay | Admitting: Family Medicine

## 2021-09-30 DIAGNOSIS — I10 Essential (primary) hypertension: Secondary | ICD-10-CM

## 2021-09-30 MED ORDER — METOPROLOL SUCCINATE ER 50 MG PO TB24: 50.0000 mg | ORAL_TABLET | Freq: Every day | ORAL | 0 refills | Status: DC

## 2021-09-30 MED ORDER — FAMOTIDINE 40 MG PO TABS: 40.0000 mg | ORAL_TABLET | Freq: Every day | ORAL | 0 refills | Status: DC | PRN

## 2021-10-26 ENCOUNTER — Other Ambulatory Visit: Payer: Self-pay | Admitting: Family Medicine

## 2021-10-26 DIAGNOSIS — I10 Essential (primary) hypertension: Secondary | ICD-10-CM

## 2021-11-04 NOTE — Progress Notes (Addendum)
[facility] [address], [address] [place], [place] [address] [phone]  Date:  11/06/2021   Name:  Angela Wall   DOB:  [birth year]   MRN:  [id number]  PCP:  Pearline Cables, MD    Chief Complaint: Annual Exam (Concerns/ questions: energy/Flu shot today: declines/Sees [facility]/No PNA or zoster in [facility]/HIV screen due) and Well Child   History of Present Illness:  Angela Wall is a 56 y.o. very pleasant female patient who presents with the following:  Patient seen today for physical exam- history of cervical spinal stenosis, hyperlipidemia, prediabetes, HTN Most recent visit with myself was November 2021 Her son (her only child) was killed in a shooting in November 2020-at the last time we talked his murder was still unresolved.  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  Unfortunately there have not been any particular breaks in her son's case  Pt notes she tends to feel tired- she wonders if this means she needs some sort of vitamin She moved to a new home recently which was a lot of work, this might of tired her out She does not feel like she is depressed -she notes an excellent support network She feels well rested some of the time when she wakes up in the am, sometimes she may still feel tired She is not aware of any snoring She is getting not a lot of exercise- at work they are planning to start an exercise program  She might like to try trazodone for sleep   Mammogram- pt has OBG care  Pap done by GYN Labs can be updated - she is fasting today for labs  Cologuard utd  Pt declines flu  Discussed shingrix-will start series today Lab Results  Component Value Date   HGBA1C 6.0 09/05/2020     Patient Active Problem List   Diagnosis Date Noted   Prediabetes 09/06/2020   Grief at loss of child 09/01/2020   Medial epicondylitis of right elbow 04/19/2019    Plantar fasciitis, left 05/24/2018   Tendonitis, Achilles, right 05/24/2018   SOB (shortness of breath) 03/07/2018   Abnormal bruising 12/09/2015   Breast pain, left 05/23/2015   Edema, lower extremity 05/23/2015   Pain in joint, shoulder region 06/05/2014   Knee pain, left 06/05/2014   Insomnia 06/05/2014   Other abnormal glucose 03/14/2012   Pure hypercholesterolemia 03/14/2012   SPINAL STENOSIS, CERVICAL 03/27/2010   Cervical spondylosis with myelopathy 03/19/2010   LACTOSE INTOLERANCE 12/31/2009   GERD 07/08/2009   Essential hypertension 07/05/2009    Past Medical History:  Diagnosis Date   Abnormal bruising 12/09/2015   Breast pain, left 05/23/2015   Cervical spondylosis with myelopathy 03/19/2010   Qualifier: Diagnosis of  By: Yetta Barre MD, Bernadene Bell.    Edema 05/23/2015   Essential hypertension 07/05/2009   Qualifier: Diagnosis of  By: Yetta Barre MD, Bernadene Bell.    GERD 07/08/2009   Qualifier: Diagnosis of  By: Yetta Barre MD, Bernadene Bell.    GERD (gastroesophageal reflux disease)    Hypertension    Insomnia 06/05/2014   Knee pain, left 06/05/2014   LACTOSE INTOLERANCE 12/31/2009   Qualifier: Diagnosis of  By: Jarold Motto MD Lang Snow    Other abnormal glucose 03/14/2012   Pain in joint involving right ankle and foot 11/06/2015   Pain in joint, shoulder region 06/05/2014   Pure hypercholesterolemia 03/14/2012   SPINAL STENOSIS,  CERVICAL 03/27/2010   Qualifier: Diagnosis of  By: Yetta Barre MD, Bernadene Bell.     Past Surgical History:  Procedure Laterality Date   BTL post-bilateral ectopics     ENDOMETRIAL ABLATION     ? Novasure   TUBAL LIGATION      Social History   Tobacco Use   Smoking status: Never   Smokeless tobacco: Never  Vaping Use   Vaping Use: Never used  Substance Use Topics   Alcohol use: Yes    Comment: social drinker   Drug use: No    Family History  Problem Relation Age of Onset   Cancer Mother        Breast cancer <50   Kidney disease Maternal Uncle    Stroke Maternal  Uncle    Hypertension Other    Heart attack Paternal Grandmother     Allergies  Allergen Reactions   Oxycodone-Acetaminophen Itching   Oxycodone-Acetaminophen     REACTION: Itching   Propoxyphene N-Acetaminophen     REACTION: Itching    Medication list has been reviewed and updated.  Current Outpatient Medications on File Prior to Visit  Medication Sig Dispense Refill   fluticasone (FLONASE) 50 MCG/ACT nasal spray SPRAY 2 SPRAYS INTO EACH NOSTRIL EVERY DAY 48 mL 0   furosemide (LASIX) 20 MG tablet TAKE 2 TABLETS BY MOUTH EVERY DAY 60 tablet 0   levocetirizine (XYZAL) 5 MG tablet TAKE 1 TABLET BY MOUTH EVERY DAY IN THE EVENING 30 tablet 2   LORazepam (ATIVAN) 0.5 MG tablet Use every 12 hours as needed for anxiety 30 tablet 0   metoprolol succinate (TOPROL-XL) 50 MG 24 hr tablet TAKE 1 TABLET BY MOUTH DAILY. TAKE WITH OR IMMEDIATELY FOLLOWING A MEAL. 30 tablet 0   olmesartan (BENICAR) 40 MG tablet TAKE 1 TABLET BY MOUTH EVERY DAY 90 tablet 0   Diclofenac Sodium (PENNSAID) 2 % SOLN Place 1 application onto the skin 2 (two) times daily. (Patient not taking: Reported on 11/06/2021) 112 g 3   No current facility-administered medications on file prior to visit.    Review of Systems:  As per HPI- otherwise negative.   Physical Examination: Vitals:   11/06/21 1435  BP: 120/82  Pulse: 68  Resp: 18  Temp: 98.7 F (37.1 C)  SpO2: 100%   Vitals:   11/06/21 1435  Weight: 172 lb 3.2 oz (78.1 kg)  Height: 5' (1.524 m)   Body mass index is 33.63 kg/m. Ideal Body Weight: Weight in (lb) to have BMI = 25: 127.7  GEN: no acute distress.  Mild obesity, looks well HEENT: Atraumatic, Normocephalic. Bilateral TM wnl, oropharynx normal.  PEERL,EOMI.   Ears and Nose: No external deformity. CV: RRR, No M/G/R. No JVD. No thrill. No extra heart sounds. PULM: CTA B, no wheezes, crackles, rhonchi. No retractions. No resp. distress. No accessory muscle use. ABD: S, NT, ND, +BS. No rebound. No  HSM. EXTR: No c/c/e PSYCH: Normally interactive. Conversant.     Assessment and Plan: Physical exam  Essential hypertension - Plan: CBC, Comprehensive metabolic panel, metoprolol succinate (TOPROL-XL) 50 MG 24 hr tablet, olmesartan (BENICAR) 40 MG tablet, famotidine (PEPCID) 40 MG tablet  Pure hypercholesterolemia - Plan: Lipid panel  Grief at loss of child  Prediabetes - Plan: Hemoglobin A1c  Screening for deficiency anemia - Plan: CBC  Screening for thyroid disorder - Plan: TSH  Fatigue, unspecified type - Plan: VITAMIN D 25 Hydroxy (Vit-D Deficiency, Fractures), TSH, B12  Adjustment insomnia - Plan: traZODone (DESYREL)  50 MG tablet  Medication monitoring encounter - Plan: furosemide (LASIX) 20 MG tablet  Immunization due - Plan: Varicella-zoster vaccine IM (Shingrix)   Physical exam today- encouraged healthy diet and exercise routine  Will plan further follow- up pending labs. Blood pressure under good control, refilled medications Start shingles series Pap and mammogram per GYN, Cologuard up-to-date Discussed her fatigue.  Lab work pending as above We will have her try trazodone as needed for sleep aid.  She will let me know how this works for her   Signed Abbe AmsterdamJessica Rayyan Orsborn, MD  Received labs 1/13- message to pt  Results for orders placed or performed in visit on 11/06/21  CBC  Result Value Ref Range   WBC 7.1 4.0 - 10.5 K/uL   RBC 4.63 3.87 - 5.11 Mil/uL   Platelets 239.0 150.0 - 400.0 K/uL   Hemoglobin 13.0 12.0 - 15.0 g/dL   HCT 09.840.6 11.936.0 - 14.746.0 %   MCV 87.6 78.0 - 100.0 fl   MCHC 32.1 30.0 - 36.0 g/dL   RDW 82.914.2 56.211.5 - 13.015.5 %  Comprehensive metabolic panel  Result Value Ref Range   Sodium 139 135 - 145 mEq/L   Potassium 4.2 3.5 - 5.1 mEq/L   Chloride 102 96 - 112 mEq/L   CO2 30 19 - 32 mEq/L   Glucose, Bld 89 70 - 99 mg/dL   BUN 9 6 - 23 mg/dL   Creatinine, Ser 8.650.75 0.40 - 1.20 mg/dL   Total Bilirubin 0.9 0.2 - 1.2 mg/dL   Alkaline Phosphatase 79  39 - 117 U/L   AST 27 0 - 37 U/L   ALT 34 0 - 35 U/L   Total Protein 6.9 6.0 - 8.3 g/dL   Albumin 4.2 3.5 - 5.2 g/dL   GFR 78.4690.76 >96.29>60.00 mL/min   Calcium 9.4 8.4 - 10.5 mg/dL  Hemoglobin B2WA1c  Result Value Ref Range   Hgb A1c MFr Bld 5.9 4.6 - 6.5 %  Lipid panel  Result Value Ref Range   Cholesterol 173 0 - 200 mg/dL   Triglycerides 41.397.0 0.0 - 149.0 mg/dL   HDL 24.4051.80 >10.27>39.00 mg/dL   VLDL 25.319.4 0.0 - 66.440.0 mg/dL   LDL Cholesterol 403102 (H) 0 - 99 mg/dL   Total CHOL/HDL Ratio 3    NonHDL 121.60   VITAMIN D 25 Hydroxy (Vit-D Deficiency, Fractures)  Result Value Ref Range   VITD 7.20 (L) 30.00 - 100.00 ng/mL  TSH  Result Value Ref Range   TSH 1.24 0.35 - 5.50 uIU/mL  B12  Result Value Ref Range   Vitamin B-12 198 (L) 211 - 911 pg/mL

## 2021-11-04 NOTE — Patient Instructions (Addendum)
Good to see you today, I will be in touch with your labs  Try trazodone at bedtime for sleep- start with a 1/2 tablet, can increase to a whole tablet if needed You can use this daily or as needed   First dose of Shingrix today- please schedule your 2nd dose in 2-6 months as a nurse visit only

## 2021-11-06 ENCOUNTER — Ambulatory Visit (INDEPENDENT_AMBULATORY_CARE_PROVIDER_SITE_OTHER): Payer: BC Managed Care – PPO | Admitting: Family Medicine

## 2021-11-06 VITALS — BP 120/82 | HR 68 | Temp 98.7°F | Resp 18 | Ht 60.0 in | Wt 172.2 lb

## 2021-11-06 DIAGNOSIS — Z23 Encounter for immunization: Secondary | ICD-10-CM

## 2021-11-06 DIAGNOSIS — R7303 Prediabetes: Secondary | ICD-10-CM | POA: Diagnosis not present

## 2021-11-06 DIAGNOSIS — E7800 Pure hypercholesterolemia, unspecified: Secondary | ICD-10-CM | POA: Diagnosis not present

## 2021-11-06 DIAGNOSIS — E538 Deficiency of other specified B group vitamins: Secondary | ICD-10-CM

## 2021-11-06 DIAGNOSIS — I10 Essential (primary) hypertension: Secondary | ICD-10-CM | POA: Diagnosis not present

## 2021-11-06 DIAGNOSIS — Z0000 Encounter for general adult medical examination without abnormal findings: Secondary | ICD-10-CM

## 2021-11-06 DIAGNOSIS — Z1329 Encounter for screening for other suspected endocrine disorder: Secondary | ICD-10-CM

## 2021-11-06 DIAGNOSIS — Z5181 Encounter for therapeutic drug level monitoring: Secondary | ICD-10-CM

## 2021-11-06 DIAGNOSIS — R5383 Other fatigue: Secondary | ICD-10-CM

## 2021-11-06 DIAGNOSIS — F4321 Adjustment disorder with depressed mood: Secondary | ICD-10-CM

## 2021-11-06 DIAGNOSIS — Z130 Encounter for screening for diseases of the blood and blood-forming organs and certain disorders involving the immune mechanism: Secondary | ICD-10-CM

## 2021-11-06 DIAGNOSIS — F5102 Adjustment insomnia: Secondary | ICD-10-CM

## 2021-11-06 DIAGNOSIS — E559 Vitamin D deficiency, unspecified: Secondary | ICD-10-CM

## 2021-11-06 DIAGNOSIS — Z634 Disappearance and death of family member: Secondary | ICD-10-CM

## 2021-11-06 MED ORDER — FAMOTIDINE 40 MG PO TABS: 40.0000 mg | ORAL_TABLET | Freq: Every day | ORAL | 3 refills | Status: AC | PRN

## 2021-11-06 MED ORDER — TRAZODONE HCL 50 MG PO TABS: 25.0000 mg | ORAL_TABLET | Freq: Every evening | ORAL | 3 refills | Status: DC | PRN

## 2021-11-06 MED ORDER — OLMESARTAN MEDOXOMIL 40 MG PO TABS: 40.0000 mg | ORAL_TABLET | Freq: Every day | ORAL | 3 refills | Status: DC

## 2021-11-06 MED ORDER — METOPROLOL SUCCINATE ER 50 MG PO TB24: 50.0000 mg | ORAL_TABLET | Freq: Every day | ORAL | 3 refills | Status: DC

## 2021-11-06 MED ORDER — FUROSEMIDE 20 MG PO TABS: 40.0000 mg | ORAL_TABLET | Freq: Every day | ORAL | 3 refills | Status: DC

## 2021-11-07 ENCOUNTER — Encounter: Payer: Self-pay | Admitting: Family Medicine

## 2021-11-07 LAB — LIPID PANEL
Cholesterol: 173 mg/dL (ref 0–200)
HDL: 51.8 mg/dL (ref >39.00)
LDL Cholesterol: 102 mg/dL — ABNORMAL HIGH (ref 0–99)
NonHDL: 121.6
Total CHOL/HDL Ratio: 3
Triglycerides: 97 mg/dL (ref 0.0–149.0)
VLDL: 19.4 mg/dL (ref 0.0–40.0)

## 2021-11-07 LAB — COMPREHENSIVE METABOLIC PANEL
ALT: 34 U/L (ref 0–35)
AST: 27 U/L (ref 0–37)
Albumin: 4.2 g/dL (ref 3.5–5.2)
Alkaline Phosphatase: 79 U/L (ref 39–117)
BUN: 9 mg/dL (ref 6–23)
CO2: 30 mEq/L (ref 19–32)
Calcium: 9.4 mg/dL (ref 8.4–10.5)
Chloride: 102 mEq/L (ref 96–112)
Creatinine, Ser: 0.75 mg/dL (ref 0.40–1.20)
GFR: 90.76 mL/min (ref >60.00)
Glucose, Bld: 89 mg/dL (ref 70–99)
Potassium: 4.2 mEq/L (ref 3.5–5.1)
Sodium: 139 mEq/L (ref 135–145)
Total Bilirubin: 0.9 mg/dL (ref 0.2–1.2)
Total Protein: 6.9 g/dL (ref 6.0–8.3)

## 2021-11-07 LAB — TSH: TSH: 1.24 u[IU]/mL (ref 0.35–5.50)

## 2021-11-07 LAB — VITAMIN B12: Vitamin B-12: 198 pg/mL — ABNORMAL LOW (ref 211–911)

## 2021-11-07 LAB — CBC
HCT: 40.6 % (ref 36.0–46.0)
Hemoglobin: 13 g/dL (ref 12.0–15.0)
MCHC: 32.1 g/dL (ref 30.0–36.0)
MCV: 87.6 fl (ref 78.0–100.0)
Platelets: 239 10*3/uL (ref 150.0–400.0)
RBC: 4.63 Mil/uL (ref 3.87–5.11)
RDW: 14.2 % (ref 11.5–15.5)
WBC: 7.1 10*3/uL (ref 4.0–10.5)

## 2021-11-07 LAB — VITAMIN D 25 HYDROXY (VIT D DEFICIENCY, FRACTURES): VITD: 7.2 ng/mL — ABNORMAL LOW (ref 30.00–100.00)

## 2021-11-07 LAB — HEMOGLOBIN A1C: Hgb A1c MFr Bld: 5.9 % (ref 4.6–6.5)

## 2021-11-07 MED ORDER — VITAMIN D3 1.25 MG (50000 UT) PO CAPS: ORAL_CAPSULE | ORAL | 0 refills | Status: DC

## 2021-11-07 NOTE — Addendum Note (Signed)
Addended by: Abbe Amsterdam C on: 11/07/2021 12:02 PM   Modules accepted: Orders

## 2021-12-08 ENCOUNTER — Other Ambulatory Visit: Payer: Self-pay | Admitting: Family Medicine

## 2021-12-08 DIAGNOSIS — F5102 Adjustment insomnia: Secondary | ICD-10-CM

## 2022-01-27 ENCOUNTER — Telehealth: Payer: Self-pay | Admitting: Family Medicine

## 2022-01-27 DIAGNOSIS — E559 Vitamin D deficiency, unspecified: Secondary | ICD-10-CM

## 2022-01-29 NOTE — Telephone Encounter (Signed)
I have called pt and relayed the message from the provider. She will take vit D OTC and call back to make a 6 month f/u appt in June 2023.   ?

## 2022-01-29 NOTE — Telephone Encounter (Signed)
Pt aware and voices understanding. She does ask if she needs to have her Vit D repeated sooner than her 6 month follow up appt? ?

## 2022-01-29 NOTE — Telephone Encounter (Signed)
Pt states she was supposed to stop taking her vit D med after she run out but she got a notification from her pharmacy telling her she was getting another refill. She would like a call back to know if she is supposed to stop taking it or continue tasking the vit D. Please advise.  ?

## 2022-05-01 ENCOUNTER — Ambulatory Visit (INDEPENDENT_AMBULATORY_CARE_PROVIDER_SITE_OTHER): Payer: BC Managed Care – PPO

## 2022-05-01 DIAGNOSIS — Z23 Encounter for immunization: Secondary | ICD-10-CM | POA: Diagnosis not present

## 2022-05-01 NOTE — Progress Notes (Signed)
Angela Wall is a 56 y.o. female presents to the office today for a shingles vaccine. 2/2  Shingrix vaccine given in L deltoid. Patient tolerated injection well.   Kathrynn Speed , CMA

## 2022-06-02 DIAGNOSIS — Z1231 Encounter for screening mammogram for malignant neoplasm of breast: Secondary | ICD-10-CM | POA: Diagnosis not present

## 2022-06-02 DIAGNOSIS — Z01419 Encounter for gynecological examination (general) (routine) without abnormal findings: Secondary | ICD-10-CM | POA: Diagnosis not present

## 2022-06-02 LAB — HM MAMMOGRAPHY

## 2022-07-24 NOTE — Progress Notes (Unsigned)
[facility] [address], [place], [place] [address] [phone]  Date:  07/27/2022   Name:  Angela Wall   DOB:  09-23-1966   MRN:  [id number]  PCP:  Darreld Mclean, MD    Chief Complaint: Pre menopausal?, Spots on legs, weight & weight loss. (Hot flashes, she would like to find out if she is Pre Menopausal. The places on her skin do not itch.)   History of Present Illness:  Angela Wall is a 56 y.o. very pleasant female patient who presents with the following:  Patient seen today for follow-up Most recent visit with myself was in January for her physical  history of cervical spinal stenosis, hyperlipidemia, prediabetes, HTN, B12 and vitamin D deficiency Her son (her only child) was killed in a shooting in November 2020-at the last time we talked his murder was still unresolved.  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  I prescribed trazodone to use as needed for sleep  Mammogram- per her gyn  Pap smear- per her gyn  Tetanus appears to be due- declines today  Flu shot- pt declines  COVID-19 booster Lab work was completed in [place] D, B12 both low at that time  She notes a couple of skin changes -she has a few small scattered hypopigmented spots on her arms and legs which appear benign.  However also has 2 new hyperpigmented areas on her left thigh which likely need a biopsy She wonders if she may be going through menopause- She had an ablation 10 years ago- no bleedings since then She has noted some hot flashes and sleep difficutly  She has been going to some sort of commercial weight loss clinic where they are treating her with vitamin shots and also started her on phentermine She is on phentermine 37.5-  taking a 1/2 tablet daily now  Advised that phentermine is of limited utility, lifestyle changes are also necessary in order to maintain any weight loss benefit.  I am willing to take over prescribing phentermine if she prefers  Patient Active Problem List   Diagnosis Date Noted   Prediabetes 09/06/2020   Grief at loss of child 09/01/2020   Medial epicondylitis of right elbow 04/19/2019   Plantar fasciitis, left 05/24/2018   Tendonitis, Achilles, right 05/24/2018   SOB (shortness of breath) 03/07/2018   Abnormal bruising 12/09/2015   Breast pain, left 05/23/2015   Edema, lower extremity 05/23/2015   Pain in joint, shoulder region 06/05/2014   Knee pain, left 06/05/2014   Insomnia 06/05/2014   Other abnormal glucose 03/14/2012   Pure hypercholesterolemia 03/14/2012   SPINAL STENOSIS, CERVICAL 03/27/2010   Cervical spondylosis with myelopathy 03/19/2010   LACTOSE INTOLERANCE 12/31/2009   GERD 07/08/2009   Essential hypertension 07/05/2009    Past Medical History:  Diagnosis Date   Abnormal bruising 12/09/2015   Breast pain, left 05/23/2015   Cervical spondylosis with myelopathy 03/19/2010   Qualifier: Diagnosis of  By: Ronnald Ramp MD, Arvid Right.    Edema 05/23/2015   Essential hypertension 07/05/2009   Qualifier: Diagnosis of  By: Ronnald Ramp MD, Arvid Right.    GERD 07/08/2009   Qualifier: Diagnosis of  By: Ronnald Ramp MD, Arvid Right.    GERD (gastroesophageal reflux disease)  Hypertension    Insomnia 06/05/2014   Knee pain, left 06/05/2014   LACTOSE INTOLERANCE 12/31/2009   Qualifier: Diagnosis of  By: Jarold Motto MD Lang Snow    Other abnormal glucose 03/14/2012   Pain in joint involving right ankle and foot 11/06/2015   Pain in joint, shoulder region 06/05/2014   Pure hypercholesterolemia 03/14/2012   SPINAL STENOSIS, CERVICAL 03/27/2010   Qualifier: Diagnosis of  By: Yetta Barre MD, Bernadene Bell.     Past Surgical History:  Procedure Laterality Date   BTL post-bilateral ectopics     ENDOMETRIAL ABLATION      ? Novasure   TUBAL LIGATION      Social History   Tobacco Use   Smoking status: Never   Smokeless tobacco: Never  Vaping Use   Vaping Use: Never used  Substance Use Topics   Alcohol use: Yes    Comment: social drinker   Drug use: No    Family History  Problem Relation Age of Onset   Cancer Mother        Breast cancer <50   Kidney disease Maternal Uncle    Stroke Maternal Uncle    Hypertension Other    Heart attack Paternal Grandmother     Allergies  Allergen Reactions   Oxycodone-Acetaminophen Itching   Oxycodone-Acetaminophen     REACTION: Itching   Propoxyphene N-Acetaminophen     REACTION: Itching    Medication list has been reviewed and updated.  Current Outpatient Medications on File Prior to Visit  Medication Sig Dispense Refill   Diclofenac Sodium (PENNSAID) 2 % SOLN Place 1 application onto the skin 2 (two) times daily. 112 g 3   famotidine (PEPCID) 40 MG tablet Take 1 tablet (40 mg total) by mouth daily as needed for heartburn or indigestion. 90 tablet 3   fluticasone (FLONASE) 50 MCG/ACT nasal spray SPRAY 2 SPRAYS INTO EACH NOSTRIL EVERY DAY 48 mL 0   furosemide (LASIX) 20 MG tablet Take 2 tablets (40 mg total) by mouth daily. 180 tablet 3   levocetirizine (XYZAL) 5 MG tablet TAKE 1 TABLET BY MOUTH EVERY DAY IN THE EVENING 30 tablet 2   LORazepam (ATIVAN) 0.5 MG tablet Use every 12 hours as needed for anxiety 30 tablet 0   metoprolol succinate (TOPROL-XL) 50 MG 24 hr tablet Take 1 tablet (50 mg total) by mouth daily. TAKE WITH OR IMMEDIATELY FOLLOWING A MEAL. 90 tablet 3   olmesartan (BENICAR) 40 MG tablet Take 1 tablet (40 mg total) by mouth daily. 90 tablet 3   phentermine (ADIPEX-P) 37.5 MG tablet Take 18.75 mg by mouth daily. Taking 1/2 tab for now.     No current facility-administered medications on file prior to visit.    Review of Systems:  As per HPI- otherwise negative.   Physical Examination: Vitals:   07/27/22 1525  BP: 122/80   Pulse: 93  Resp: 18  Temp: 97.7 F (36.5 C)  SpO2: 99%   Vitals:   07/27/22 1525  Weight: 173 lb 12.8 oz (78.8 kg)  Height: 5' (1.524 m)   Body mass index is 33.94 kg/m. Ideal Body Weight: Weight in (lb) to have BMI = 25: 127.7  GEN: no acute distress.  Mild obesity, looks well HEENT: Atraumatic, Normocephalic.  Ears and Nose: No external deformity. CV: RRR, No M/G/R. No JVD. No thrill. No extra heart sounds. PULM: CTA B, no wheezes, crackles, rhonchi. No retractions. No resp. distress. No accessory muscle use. ABD: S, NT, ND, +BS. No rebound.  No HSM. EXTR: No c/c/e PSYCH: Normally interactive. Conversant.  2 discrete, hyperpigmented lesions on her left thigh, the largest is about 6 mm in diameter.  Pigmentation is varied, they are minimally raised.  Patient states these are new  Assessment and Plan: Vitamin D deficiency - Plan: VITAMIN D 25 Hydroxy (Vit-D Deficiency, Fractures)  Vitamin B12 deficiency - Plan: Vitamin B12  Prediabetes - Plan: Hemoglobin A1c  Hot flashes - Plan: FSH  Obesity (BMI 30.0-34.9)  Patient seen today for follow-up.  We will check her vitamin D and her B12 today She is likely in menopause, menses are not helpful due to history of ablation.  Check FSH Discussed her obesity treatment.  She is taking phentermine and tolerating it fine, blood pressure looks good.  It sounds as though the commercial weight loss center she is attending is giving her probably unnecessary vitamin injections and charging her extra fees.  She wonders if I can take over her phentermine and I am willing to do so Scheduled her for return visit for skin biopsy  Signed Abbe Amsterdam, MD  Addendum 10/3, received labs as below message to patient  Results for orders placed or performed in visit on 07/27/22  [facility]  Result Value Ref Range   FSH 23.1 mIU/ML  VITAMIN D 25 Hydroxy (Vit-D Deficiency, Fractures)  Result Value Ref Range   VITD 33.16 30.00 - 100.00 ng/mL  Vitamin  B12  Result Value Ref Range   Vitamin B-12 >1500 (H) 211 - 911 pg/mL  Hemoglobin A1c  Result Value Ref Range   Hgb A1c MFr Bld 5.9 4.6 - 6.5 %

## 2022-07-27 ENCOUNTER — Ambulatory Visit: Payer: BC Managed Care – PPO | Admitting: Family Medicine

## 2022-07-27 VITALS — BP 122/80 | HR 93 | Temp 97.7°F | Resp 18 | Ht 60.0 in | Wt 173.8 lb

## 2022-07-27 DIAGNOSIS — E538 Deficiency of other specified B group vitamins: Secondary | ICD-10-CM | POA: Diagnosis not present

## 2022-07-27 DIAGNOSIS — R232 Flushing: Secondary | ICD-10-CM

## 2022-07-27 DIAGNOSIS — E669 Obesity, unspecified: Secondary | ICD-10-CM

## 2022-07-27 DIAGNOSIS — E559 Vitamin D deficiency, unspecified: Secondary | ICD-10-CM | POA: Diagnosis not present

## 2022-07-27 DIAGNOSIS — R7303 Prediabetes: Secondary | ICD-10-CM

## 2022-07-27 NOTE — Patient Instructions (Addendum)
Good to see you today- I will be in touch with your labs asap Let me know when you need more phentermine We will see each other soon to biopsy the spot on your leg

## 2022-07-28 ENCOUNTER — Encounter: Payer: Self-pay | Admitting: Family Medicine

## 2022-07-28 ENCOUNTER — Telehealth: Payer: Self-pay | Admitting: Family Medicine

## 2022-07-28 LAB — VITAMIN B12: Vitamin B-12: >1500 pg/mL — ABNORMAL HIGH (ref 211–911)

## 2022-07-28 LAB — HEMOGLOBIN A1C: Hgb A1c MFr Bld: 5.9 % (ref 4.6–6.5)

## 2022-07-28 LAB — FOLLICLE STIMULATING HORMONE: FSH: 23.1 m[IU]/mL

## 2022-07-28 LAB — VITAMIN D 25 HYDROXY (VIT D DEFICIENCY, FRACTURES): VITD: 33.16 ng/mL (ref 30.00–100.00)

## 2022-07-28 NOTE — Telephone Encounter (Signed)
Pt called asking if there was anything she needed to do regarding her skin biopsy appt (eat beforehand, fasting, etc). Please Advise

## 2022-07-29 ENCOUNTER — Encounter: Payer: Self-pay | Admitting: Family Medicine

## 2022-07-29 NOTE — Telephone Encounter (Signed)
Called and let the pt know that she does not need to fast.

## 2022-08-10 ENCOUNTER — Encounter: Payer: Self-pay | Admitting: Family Medicine

## 2022-08-10 MED ORDER — PHENTERMINE HCL 37.5 MG PO TABS: 37.5000 mg | ORAL_TABLET | Freq: Every day | ORAL | 1 refills | Status: DC

## 2022-08-15 NOTE — Progress Notes (Unsigned)
[facility] [address], [address] [place], [place] [address] [phone]  Date:  08/17/2022   Name:  Angela Wall   DOB:  [birth year]   MRN:  [id number]  PCP:  Pearline Cables, MD    Chief Complaint: skin biopsy (Concerns/ questions: none/)   History of Present Illness:  Angela Wall is a 56 y.o. very pleasant female patient who presents with the following:  Patient seen today for skin biopsy I saw her earlier this month for follow-up- history of cervical spinal stenosis, hyperlipidemia, prediabetes, HTN, B12 and vitamin D deficiency She had 2 hyperpigmented areas on her left thigh-we would like to get a biopsy to make sure nothing of concern She otherwise feels well  Patient Active Problem List   Diagnosis Date Noted   Prediabetes 09/06/2020   Grief at loss of child 09/01/2020   Medial epicondylitis of right elbow 04/19/2019   Plantar fasciitis, left 05/24/2018   Tendonitis, Achilles, right 05/24/2018   SOB (shortness of breath) 03/07/2018   Abnormal bruising 12/09/2015   Breast pain, left 05/23/2015   Edema, lower extremity 05/23/2015   Pain in joint, shoulder region 06/05/2014   Knee pain, left 06/05/2014   Insomnia 06/05/2014   Other abnormal glucose 03/14/2012   Pure hypercholesterolemia 03/14/2012   SPINAL STENOSIS, CERVICAL 03/27/2010   Cervical spondylosis with myelopathy 03/19/2010   LACTOSE INTOLERANCE 12/31/2009   GERD 07/08/2009   Essential hypertension 07/05/2009    Past Medical History:  Diagnosis Date   Abnormal bruising 12/09/2015   Breast pain, left 05/23/2015   Cervical spondylosis with myelopathy 03/19/2010   Qualifier: Diagnosis of  By: Yetta Barre MD, Bernadene Bell.    Edema 05/23/2015   Essential hypertension 07/05/2009   Qualifier: Diagnosis of  By: Yetta Barre MD, Bernadene Bell.    GERD 07/08/2009   Qualifier: Diagnosis of  By: Yetta Barre MD, Bernadene Bell.    GERD (gastroesophageal reflux disease)     Hypertension    Insomnia 06/05/2014   Knee pain, left 06/05/2014   LACTOSE INTOLERANCE 12/31/2009   Qualifier: Diagnosis of  By: Jarold Motto MD Lang Snow    Other abnormal glucose 03/14/2012   Pain in joint involving right ankle and foot 11/06/2015   Pain in joint, shoulder region 06/05/2014   Pure hypercholesterolemia 03/14/2012   SPINAL STENOSIS, CERVICAL 03/27/2010   Qualifier: Diagnosis of  By: Yetta Barre MD, Bernadene Bell.     Past Surgical History:  Procedure Laterality Date   BTL post-bilateral ectopics     ENDOMETRIAL ABLATION     ? Novasure   TUBAL LIGATION      Social History   Tobacco Use   Smoking status: Never   Smokeless tobacco: Never  Vaping Use   Vaping Use: Never used  Substance Use Topics   Alcohol use: Yes    Comment: social drinker   Drug use: No    Family History  Problem Relation Age of Onset   Cancer Mother        Breast cancer <50   Kidney disease Maternal Uncle    Stroke Maternal Uncle    Hypertension Other    Heart attack Paternal Grandmother     Allergies  Allergen Reactions   Oxycodone-Acetaminophen Itching   Oxycodone-Acetaminophen     REACTION: Itching   Propoxyphene N-Acetaminophen     REACTION: Itching    Medication list has been reviewed and updated.  Current Outpatient Medications on File Prior  to Visit  Medication Sig Dispense Refill   Diclofenac Sodium (PENNSAID) 2 % SOLN Place 1 application onto the skin 2 (two) times daily. 112 g 3   famotidine (PEPCID) 40 MG tablet Take 1 tablet (40 mg total) by mouth daily as needed for heartburn or indigestion. 90 tablet 3   fluticasone (FLONASE) 50 MCG/ACT nasal spray SPRAY 2 SPRAYS INTO EACH NOSTRIL EVERY DAY 48 mL 0   furosemide (LASIX) 20 MG tablet Take 2 tablets (40 mg total) by mouth daily. 180 tablet 3   levocetirizine (XYZAL) 5 MG tablet TAKE 1 TABLET BY MOUTH EVERY DAY IN THE EVENING 30 tablet 2   LORazepam (ATIVAN) 0.5 MG tablet Use every 12 hours as needed for anxiety 30 tablet 0    metoprolol succinate (TOPROL-XL) 50 MG 24 hr tablet Take 1 tablet (50 mg total) by mouth daily. TAKE WITH OR IMMEDIATELY FOLLOWING A MEAL. 90 tablet 3   olmesartan (BENICAR) 40 MG tablet Take 1 tablet (40 mg total) by mouth daily. 90 tablet 3   phentermine (ADIPEX-P) 37.5 MG tablet Take 1 tablet (37.5 mg total) by mouth daily. 30 tablet 1   No current facility-administered medications on file prior to visit.    Review of Systems:  As per HPI- otherwise negative.   Physical Examination: Vitals:   08/17/22 1413  BP: 118/64  Pulse: 94  Resp: 18  Temp: 97.8 F (36.6 C)  SpO2: 99%   Vitals:   08/17/22 1413  Weight: 173 lb 9.6 oz (78.7 kg)  Height: 5' (1.524 m)   Body mass index is 33.9 kg/m. Ideal Body Weight: Weight in (lb) to have BMI = 25: 127.7 GEN: No acute distress; alert,appropriate. PULM: Breathing comfortably in no respiratory distress PSYCH: Normally interactive.  Well-appearing woman, mildly obese  Her left inner thigh displays 2 discrete hyperpigmented macules, 1 approximately 1 cm diameter, the other 1 7 mm in diameter Patient gives verbal consent for punch biopsy of the larger lesion Risk, benefits discussed  Skin prepped with Betadine and alcohol swab Anesthesia obtained with 1.5 mL of 1% lidocaine with epi Superficial biopsy of hyperpigmented skin taken with a 4 mm punch.  Suture not indicated given superficiality of biopsy Hemostasis obtained with silver nitrate, bandage placed Estimated blood loss less than 2 mL Patient tolerated procedure well with no immediate complications  Assessment and Plan: Skin lesion - Plan: Dermatology pathology  Biopsy of skin lesion to ensure benign.  Discussed wound care and gave written instructions I will be in touch pending her pathology report She will report any complications or concerns  Signed Lamar Blinks, MD

## 2022-08-15 NOTE — Patient Instructions (Incomplete)
It was great to see you today, I will be in touch with your pathology for a soon as possible  Leave the bandage in place overnight, tomorrow you can shower as usual and replace a new bandage as needed Please let me know right away if any sign of infection such as redness, discharge, increased pain If any bleeding apply firm pressure for 10 to 20 minutes, stop bleeding please seek further care

## 2022-08-17 ENCOUNTER — Ambulatory Visit: Payer: BC Managed Care – PPO | Admitting: Family Medicine

## 2022-08-17 VITALS — BP 118/64 | HR 94 | Temp 97.8°F | Resp 18 | Ht 60.0 in | Wt 173.6 lb

## 2022-08-17 DIAGNOSIS — L821 Other seborrheic keratosis: Secondary | ICD-10-CM

## 2022-08-17 DIAGNOSIS — L989 Disorder of the skin and subcutaneous tissue, unspecified: Secondary | ICD-10-CM

## 2022-08-19 ENCOUNTER — Encounter: Payer: Self-pay | Admitting: Family Medicine

## 2022-08-20 MED ORDER — CEPHALEXIN 500 MG PO CAPS: 500.0000 mg | ORAL_CAPSULE | Freq: Three times a day (TID) | ORAL | 0 refills | Status: DC

## 2022-08-20 NOTE — Telephone Encounter (Signed)
Nurse Assessment Nurse: Ellery Plunk, RN, Danica Date/Time (Eastern Time): 08/19/2022 8:22:34 PM Confirm and document reason for call. If symptomatic, describe symptoms. ---Caller states she had a biopsy done on her upper thigh and looks swollen and red. is tender to touch and sometimes a sharp pain will go up her leg. pain 2//10 Does the patient have any new or worsening symptoms? ---Yes Will a triage be completed? ---Yes Related visit to physician within the last 2 weeks? ---Yes Does the PT have any chronic conditions? (i.e. diabetes, asthma, this includes High risk factors for pregnancy, etc.) ---No Is the patient pregnant or possibly pregnant? (Ask all females between the ages of 41-55) ---No Is this a behavioral health or substance abuse call? ---No Guidelines Guideline Title Affirmed Question Affirmed Notes Nurse Date/Time Eilene Ghazi Time) Post-Op Incision Symptoms and Questions [1] Incision looks infected (spreading redness, pain) AND [2] large red area (> 2 in. or 5 cm) Bringas, RN, Danica 08/19/2022 8:25:18 PM Disp. Time Eilene Ghazi Time) Disposition Final User 08/19/2022 7:56:06 PM Send to [place], RN, Maudry Mayhew PLEASE NOTE: All timestamps contained within this report are represented as [place] Standard Time. CONFIDENTIALTY NOTICE: This fax transmission is intended only for the addressee. It contains information that is legally privileged, confidential or otherwise protected from use or disclosure. If you are not the intended recipient, you are strictly prohibited from reviewing, disclosing, copying using or disseminating any of this information or taking any action in reliance on or regarding this information. If you have received this fax in error, please notify us immediately by telephone so that we can arrange for its return to [place]. Phone: [phone], Toll-Free: [phone], Fax: [phone] Page: 2 of 2 Call Id: [id number] [place]. Time Eilene Ghazi Time) Disposition  Final User 08/19/2022 8:31:48 PM See HCP within 4 Hours (or PCP triage) Yes Ellery Plunk, RN, Danica Final Disposition 08/19/2022 8:31:48 PM See HCP within 4 Hours (or PCP triage) Yes Bringas, RN, Danica Caller Disagree/Comply Comply Caller Understands Yes PreDisposition InappropriateToAsk Care Advice Given Per Guideline SEE HCP (OR PCP TRIAGE) WITHIN 4 HOURS: * IF OFFICE WILL BE CLOSED AND NO PCP (PRIMARY CARE PROVIDER) SECOND-LEVEL TRIAGE: You need to be seen within the next 3 or 4 hours. A nearby Urgent Care Center [facility]) is often a good source of care. Another choice is to go to the ED. Go sooner if you become worse. CALL BACK IF: * You become worse CARE ADVICE per Post-Op Incision Symptoms and Questions (Adult) guideline. Referrals GO TO FACILITY UNDECIDED

## 2022-08-20 NOTE — Telephone Encounter (Signed)
Called pt to check on her.  She has the keflex and took one dose so far.  Told her to take 2 more doses today.  Neosporin ok if she likes  She otherwise feels well- no fever, etc. No drainage from wound.  I will check on her tomorrow

## 2022-08-21 ENCOUNTER — Encounter: Payer: Self-pay | Admitting: Family Medicine

## 2022-08-22 ENCOUNTER — Encounter: Payer: Self-pay | Admitting: Family Medicine

## 2022-08-23 NOTE — Telephone Encounter (Signed)
Called patient on 10/28, she reported area was still red but tenderness is much improved.  I advised her that she had a benign seborrheic keratosis, asked her to please let me know if not continuing to improve and she agreed

## 2022-08-24 ENCOUNTER — Encounter: Payer: Self-pay | Admitting: Family Medicine

## 2022-08-24 ENCOUNTER — Ambulatory Visit (INDEPENDENT_AMBULATORY_CARE_PROVIDER_SITE_OTHER): Payer: BC Managed Care – PPO | Admitting: Family Medicine

## 2022-08-24 VITALS — BP 130/80 | HR 72 | Resp 18 | Wt 172.0 lb

## 2022-08-24 DIAGNOSIS — L03116 Cellulitis of left lower limb: Secondary | ICD-10-CM

## 2022-08-24 MED ORDER — AMOXICILLIN-POT CLAVULANATE 875-125 MG PO TABS: 1.0000 | ORAL_TABLET | Freq: Two times a day (BID) | ORAL | 0 refills | Status: DC

## 2022-08-24 NOTE — Progress Notes (Signed)
[facility] [address], [address] [place], [place] [address] [phone]  Date:  08/24/2022   Name:  Angela Wall   DOB:  05-15-1966   MRN:  [id number]  PCP:  Pearline Cables, MD    Chief Complaint: biopsy recheck   History of Present Illness:  Angela Wall is a 56 y.o. very pleasant female patient who presents with the following:  Pt seen today to recheck a biopsy site for concern of infection We did a superficial punch bx of her left thigh last week- 10/23- result seb k Pt contacted me 2 days later with concern of possible infection and we started keflex   She has been using her Keflex, contacted me today with concern that they are still appear to be infected.  I asked her to come in for recheck Otherwise she feels well, no fever or other symptoms  Patient Active Problem List   Diagnosis Date Noted   Prediabetes 09/06/2020   Grief at loss of child 09/01/2020   Medial epicondylitis of right elbow 04/19/2019   Plantar fasciitis, left 05/24/2018   Tendonitis, Achilles, right 05/24/2018   SOB (shortness of breath) 03/07/2018   Abnormal bruising 12/09/2015   Breast pain, left 05/23/2015   Edema, lower extremity 05/23/2015   Pain in joint, shoulder region 06/05/2014   Knee pain, left 06/05/2014   Insomnia 06/05/2014   Other abnormal glucose 03/14/2012   Pure hypercholesterolemia 03/14/2012   SPINAL STENOSIS, CERVICAL 03/27/2010   Cervical spondylosis with myelopathy 03/19/2010   LACTOSE INTOLERANCE 12/31/2009   GERD 07/08/2009   Essential hypertension 07/05/2009    Past Medical History:  Diagnosis Date   Abnormal bruising 12/09/2015   Breast pain, left 05/23/2015   Cervical spondylosis with myelopathy 03/19/2010   Qualifier: Diagnosis of  By: Yetta Barre MD, Bernadene Bell.    Edema 05/23/2015   Essential hypertension 07/05/2009   Qualifier: Diagnosis of  By: Yetta Barre MD, Bernadene Bell.    GERD 07/08/2009   Qualifier:  Diagnosis of  By: Yetta Barre MD, Bernadene Bell.    GERD (gastroesophageal reflux disease)    Hypertension    Insomnia 06/05/2014   Knee pain, left 06/05/2014   LACTOSE INTOLERANCE 12/31/2009   Qualifier: Diagnosis of  By: Jarold Motto MD Lang Snow    Other abnormal glucose 03/14/2012   Pain in joint involving right ankle and foot 11/06/2015   Pain in joint, shoulder region 06/05/2014   Pure hypercholesterolemia 03/14/2012   SPINAL STENOSIS, CERVICAL 03/27/2010   Qualifier: Diagnosis of  By: Yetta Barre MD, Bernadene Bell.     Past Surgical History:  Procedure Laterality Date   BTL post-bilateral ectopics     ENDOMETRIAL ABLATION     ? Novasure   TUBAL LIGATION      Social History   Tobacco Use   Smoking status: Never   Smokeless tobacco: Never  Vaping Use   Vaping Use: Never used  Substance Use Topics   Alcohol use: Yes    Comment: social drinker   Drug use: No    Family History  Problem Relation Age of Onset   Cancer Mother        Breast cancer <50   Kidney disease Maternal Uncle    Stroke Maternal Uncle    Hypertension Other    Heart attack Paternal Grandmother     Allergies  Allergen Reactions   Oxycodone-Acetaminophen Itching   Oxycodone-Acetaminophen     REACTION: Itching  Propoxyphene N-Acetaminophen     REACTION: Itching    Medication list has been reviewed and updated.  Current Outpatient Medications on File Prior to Visit  Medication Sig Dispense Refill   cephALEXin (KEFLEX) 500 MG capsule Take 1 capsule (500 mg total) by mouth 3 (three) times daily. 21 capsule 0   Diclofenac Sodium (PENNSAID) 2 % SOLN Place 1 application onto the skin 2 (two) times daily. 112 g 3   famotidine (PEPCID) 40 MG tablet Take 1 tablet (40 mg total) by mouth daily as needed for heartburn or indigestion. 90 tablet 3   fluticasone (FLONASE) 50 MCG/ACT nasal spray SPRAY 2 SPRAYS INTO EACH NOSTRIL EVERY DAY 48 mL 0   furosemide (LASIX) 20 MG tablet Take 2 tablets (40 mg total) by mouth daily. 180  tablet 3   levocetirizine (XYZAL) 5 MG tablet TAKE 1 TABLET BY MOUTH EVERY DAY IN THE EVENING 30 tablet 2   LORazepam (ATIVAN) 0.5 MG tablet Use every 12 hours as needed for anxiety 30 tablet 0   metoprolol succinate (TOPROL-XL) 50 MG 24 hr tablet Take 1 tablet (50 mg total) by mouth daily. TAKE WITH OR IMMEDIATELY FOLLOWING A MEAL. 90 tablet 3   olmesartan (BENICAR) 40 MG tablet Take 1 tablet (40 mg total) by mouth daily. 90 tablet 3   phentermine (ADIPEX-P) 37.5 MG tablet Take 1 tablet (37.5 mg total) by mouth daily. 30 tablet 1   No current facility-administered medications on file prior to visit.    Review of Systems:  As per HPI- otherwise negative.   Physical Examination: Vitals:   08/24/22 1154  BP: 130/80  Pulse: 72  Resp: 18  SpO2: 98%   Vitals:   08/24/22 1154  Weight: 172 lb (78 kg)   Body mass index is 33.59 kg/m. Ideal Body Weight:    GEN: no acute distress.  Mildly obese, looks well HEENT: Atraumatic, Normocephalic.  Ears and Nose: No external deformity. CV: RRR, No M/G/R. No JVD. No thrill. No extra heart sounds. PULM: CTA B, no wheezes, crackles, rhonchi. No retractions. No resp. distress. No accessory muscle use. EXTR: No c/c/e PSYCH: Normally interactive. Conversant.  Punch biopsy site on left inner thigh is mildly indurated, no fluctuance or abscess.  No purulent drainage; suspect with the patient noticed this morning was soft eschar material The area is mildly tender, patient notes it is improved  Assessment and Plan: Cellulitis of left lower extremity - Plan: amoxicillin-clavulanate (AUGMENTIN) 875-125 MG tablet Patient seen today with cellulitis of biopsy site on left leg.  She has improved somewhat with Keflex but not satisfactory.  We will stop Keflex and start Augmentin.  I have asked her to let me know if not seeing improvement in the next 1 to 2 days No charge-procedure follow-up Signed Lamar Blinks, MD

## 2022-08-24 NOTE — Patient Instructions (Addendum)
Good to see you today - I am sorry your biopsy site is being so troublesome! Let's change you over to augmentin antibiotic - stop what you are taking and switch over today Please keep lightly covered and wash with soap and water and normal bathing times Let me know if not improving in 1-2 days!

## 2022-08-25 ENCOUNTER — Encounter: Payer: Self-pay | Admitting: Family Medicine

## 2022-08-27 MED ORDER — DOXYCYCLINE HYCLATE 100 MG PO CAPS: 100.0000 mg | ORAL_CAPSULE | Freq: Two times a day (BID) | ORAL | 0 refills | Status: DC

## 2022-08-27 NOTE — Telephone Encounter (Signed)
Called pt on the phone and she sent me a photo of the biopsy site.  It does look better but still some surrounding erythema- more than I would like Will add doxy 100 BID for 5 days Asked her to keep me updated

## 2022-10-09 ENCOUNTER — Encounter: Payer: Self-pay | Admitting: Family Medicine

## 2022-10-09 MED ORDER — PHENTERMINE HCL 37.5 MG PO TABS: 37.5000 mg | ORAL_TABLET | Freq: Every day | ORAL | 2 refills | Status: DC

## 2022-10-29 ENCOUNTER — Other Ambulatory Visit: Payer: Self-pay | Admitting: Family Medicine

## 2022-10-29 DIAGNOSIS — I10 Essential (primary) hypertension: Secondary | ICD-10-CM

## 2022-11-07 ENCOUNTER — Other Ambulatory Visit: Payer: Self-pay | Admitting: Family Medicine

## 2022-11-07 DIAGNOSIS — I10 Essential (primary) hypertension: Secondary | ICD-10-CM

## 2022-11-08 ENCOUNTER — Other Ambulatory Visit: Payer: Self-pay | Admitting: Family Medicine

## 2022-11-08 DIAGNOSIS — I10 Essential (primary) hypertension: Secondary | ICD-10-CM

## 2022-11-09 ENCOUNTER — Telehealth: Payer: Self-pay | Admitting: Family Medicine

## 2022-11-09 ENCOUNTER — Other Ambulatory Visit: Payer: Self-pay | Admitting: Family Medicine

## 2022-11-09 DIAGNOSIS — I10 Essential (primary) hypertension: Secondary | ICD-10-CM

## 2022-11-09 NOTE — Telephone Encounter (Signed)
Pt states pharmacy has not received rx for metoprolol succinate (TOPROL-XL) 50 MG 24 hr tablet and she would like to know if this can be resent.    [id number] [place] [facility], [place] - [address] [place] [address] Phone: [phone]  Fax: [phone]

## 2022-11-09 NOTE — Telephone Encounter (Signed)
Patient called to follow up and advised pharmacy still hasn't received the medication for metoprolol. The pharmacy keeps telling her that the prescription expired on the 12th. Pt was advised that it has been sent to the nurse.Please advise patient when prescription is resent. She was able to get a few pills from the pharmacy.

## 2022-11-10 NOTE — Telephone Encounter (Signed)
Rx has been sent in:   Dose, Route, Frequency: As Directed  Dispense Quantity: 90 tablet Refills: 1        Sig: TAKE 1 TABLET BY MOUTH EVERY DAY WITH OR IMMEDIATELY FOLLOWING A MEAL       Start Date: 11/10/22 End Date: --  Written Date: 11/10/22 Expiration Date: 11/10/23     Associated Diagnoses: Essential hypertension [I10]  Original Order: metoprolol succinate (TOPROL-XL) 50 MG 24 hr tablet [[id number]]  Providers  Ordering and Authorizing Provider: Darreld Mclean, [place] [address], [place] [address] Phone: [phone]   Fax: [phone] DEA #: [id number]   NPI: [id number]      Ordering User: Doylene Canning, [place]  [id number] [place], [place] - [address] [place] [address] Phone: [phone]  Fax: [phone]

## 2023-02-08 ENCOUNTER — Encounter: Payer: Self-pay | Admitting: *Deleted

## 2023-02-09 NOTE — Patient Instructions (Incomplete)
It was great to see again today, I will be in touch with your labs

## 2023-02-09 NOTE — Progress Notes (Addendum)
[facility] [address], [address] [place], [place] [address] [phone]  Date:  02/11/2023   Name:  Angela Wall   DOB:  12/02/1965   MRN:  [id number]  PCP:  Pearline Cables, MD    Chief Complaint: Annual Exam (Concerns/ questions: 1. Weight GLP-1 2. The place on her leg is dark and itchy. )   History of Present Illness:  Angela Wall is a 57 y.o. very pleasant female patient who presents with the following:  Patient seen today for physical exam- history of cervical spinal stenosis, hyperlipidemia, prediabetes, HTN, B12 and vitamin D deficiency  Most recent visit with myself was in October to follow-up with punch biopsy site on her leg She lost her only child to a shooting in November 2020  She is fasting for labs  Mammogram- per [employer] [facility]- [facility] -requested records today Pap screening- see above Tetanus is due for update- give today  Recommend COVID-19 booster Cologuard up-to-date  Lasix 20-2 tabs by mouth daily Toprol XL 50 Olmesartan 40 Phentermine  She notes some itching of the area where we did a punch bx last year- she thinks it may be scar tissue She was using phentermine last year but was not losing much weight so she stopped using it She wonders about using a GLP-1 for weight loss; she does not have any contraindication for use of this medication.  I do think it would likely assist her with weight loss.  She will get in touch with her insurance company and see if they cover GLP-1 agonist for weight loss   Wt Readings from Last 3 Encounters:  02/11/23 177 lb 12.8 oz (80.6 kg)  08/24/22 172 lb (78 kg)  08/17/22 173 lb 9.6 oz (78.7 kg)    Patient Active Problem List   Diagnosis Date Noted   Prediabetes 09/06/2020   Grief at loss of child 09/01/2020   Medial epicondylitis of right elbow 04/19/2019   Plantar fasciitis, left 05/24/2018   Tendonitis, Achilles, right 05/24/2018    SOB (shortness of breath) 03/07/2018   Abnormal bruising 12/09/2015   Breast pain, left 05/23/2015   Edema, lower extremity 05/23/2015   Pain in joint, shoulder region 06/05/2014   Knee pain, left 06/05/2014   Insomnia 06/05/2014   Other abnormal glucose 03/14/2012   Pure hypercholesterolemia 03/14/2012   SPINAL STENOSIS, CERVICAL 03/27/2010   Cervical spondylosis with myelopathy 03/19/2010   LACTOSE INTOLERANCE 12/31/2009   GERD 07/08/2009   Essential hypertension 07/05/2009    Past Medical History:  Diagnosis Date   Abnormal bruising 12/09/2015   Breast pain, left 05/23/2015   Cervical spondylosis with myelopathy 03/19/2010   Qualifier: Diagnosis of  By: Yetta Barre MD, Bernadene Bell.    Edema 05/23/2015   Essential hypertension 07/05/2009   Qualifier: Diagnosis of  By: Yetta Barre MD, Bernadene Bell.    GERD 07/08/2009   Qualifier: Diagnosis of  By: Yetta Barre MD, Bernadene Bell.    GERD (gastroesophageal reflux disease)    Hypertension    Insomnia 06/05/2014   Knee pain, left 06/05/2014   LACTOSE INTOLERANCE 12/31/2009   Qualifier: Diagnosis of  By: Jarold Motto MD Lang Snow    Other abnormal glucose 03/14/2012   Pain in joint involving right ankle and foot 11/06/2015   Pain in joint, shoulder region 06/05/2014   Pure hypercholesterolemia 03/14/2012   SPINAL STENOSIS, CERVICAL 03/27/2010   Qualifier: Diagnosis of  By: Yetta Barre MD, Maisie Fus  L.     Past Surgical History:  Procedure Laterality Date   BTL post-bilateral ectopics     ENDOMETRIAL ABLATION     ? Novasure   TUBAL LIGATION      Social History   Tobacco Use   Smoking status: Never   Smokeless tobacco: Never  Vaping Use   Vaping Use: Never used  Substance Use Topics   Alcohol use: Yes    Comment: social drinker   Drug use: No    Family History  Problem Relation Age of Onset   Cancer Mother        Breast cancer <50   Kidney disease Maternal Uncle    Stroke Maternal Uncle    Hypertension Other    Heart attack Paternal Grandmother      Allergies  Allergen Reactions   Oxycodone-Acetaminophen Itching   Oxycodone-Acetaminophen     REACTION: Itching   Propoxyphene N-Acetaminophen     REACTION: Itching    Medication list has been reviewed and updated.  Current Outpatient Medications on File Prior to Visit  Medication Sig Dispense Refill   Diclofenac Sodium (PENNSAID) 2 % SOLN Place 1 application onto the skin 2 (two) times daily. 112 g 3   famotidine (PEPCID) 40 MG tablet Take 1 tablet (40 mg total) by mouth daily as needed for heartburn or indigestion. 90 tablet 3   fluticasone (FLONASE) 50 MCG/ACT nasal spray SPRAY 2 SPRAYS INTO EACH NOSTRIL EVERY DAY 48 mL 0   furosemide (LASIX) 20 MG tablet Take 2 tablets (40 mg total) by mouth daily. 180 tablet 3   levocetirizine (XYZAL) 5 MG tablet TAKE 1 TABLET BY MOUTH EVERY DAY IN THE EVENING 30 tablet 2   LORazepam (ATIVAN) 0.5 MG tablet Use every 12 hours as needed for anxiety 30 tablet 0   metoprolol succinate (TOPROL-XL) 50 MG 24 hr tablet TAKE 1 TABLET BY MOUTH EVERY DAY WITH OR IMMEDIATELY FOLLOWING A MEAL 90 tablet 1   olmesartan (BENICAR) 40 MG tablet TAKE 1 TABLET BY MOUTH EVERY DAY 90 tablet 2   No current facility-administered medications on file prior to visit.    Review of Systems:  As per HPI- otherwise negative.   Physical Examination: Vitals:   02/11/23 1351  BP: 118/62  Pulse: 83  Resp: 18  Temp: 97.8 F (36.6 C)  SpO2: 99%   Vitals:   02/11/23 1351  Weight: 177 lb 12.8 oz (80.6 kg)  Height: 5' (1.524 m)   Body mass index is 34.72 kg/m. Ideal Body Weight: Weight in (lb) to have BMI = 25: 127.7  GEN: no acute distress.  Mildly obese, looks well HEENT: Atraumatic, Normocephalic. Bilateral TM wnl, oropharynx normal.  PEERL,EOMI.   Ears and Nose: No external deformity. CV: RRR, No M/G/R. No JVD. No thrill. No extra heart sounds. PULM: CTA B, no wheezes, crackles, rhonchi. No retractions. No resp. distress. No accessory muscle use. ABD: S,  NT, ND, +BS. No rebound. No HSM. EXTR: No c/c/e PSYCH: Normally interactive. Conversant.  Site of punch biopsy performed on left thigh previously reveals a mildly hypertrophic scar.  Assessment and Plan: Physical exam  Vitamin B12 deficiency - Plan: B12  Prediabetes - Plan: Comprehensive metabolic panel, Hemoglobin A1c  Vitamin D deficiency - Plan: VITAMIN D 25 Hydroxy (Vit-D Deficiency, Fractures)  Essential hypertension - Plan: CBC, Comprehensive metabolic panel  Grief at loss of child  Pure hypercholesterolemia - Plan: Lipid panel  Obesity (BMI 30.0-34.9)  Fatigue, unspecified type - Plan: TSH, VITAMIN  D 25 Hydroxy (Vit-D Deficiency, Fractures)  Immunization due - Plan: Td vaccine greater than or equal to 7yo preservative free IM  Physical exam today.  Encouraged healthy diet and exercise routine Will plan further follow- up pending labs. Updated tetanus Request records from her gynecologist regarding her Pap and mammogram report Discussed obesity, she is interested in trying a GLP-1 drug to assist with weight loss.  She will look into insurance benefits.  If these medications are not covered we can try phentermine again if she would like Will plan further follow- up pending labs.   Signed Abbe Amsterdam, MD  Addendum 4/19, received labs as below.  Message to patient  Results for orders placed or performed in visit on 02/11/23  CBC  Result Value Ref Range   WBC 8.0 4.0 - 10.5 K/uL   RBC 4.70 3.87 - 5.11 Mil/uL   Platelets 272.0 150.0 - 400.0 K/uL   Hemoglobin 13.2 12.0 - 15.0 g/dL   HCT 95.2 84.1 - 32.4 %   MCV 84.3 78.0 - 100.0 fl   MCHC 33.4 30.0 - 36.0 g/dL   RDW 40.1 02.7 - 25.3 %  Comprehensive metabolic panel  Result Value Ref Range   Sodium 137 135 - 145 mEq/L   Potassium 4.0 3.5 - 5.1 mEq/L   Chloride 100 96 - 112 mEq/L   CO2 29 19 - 32 mEq/L   Glucose, Bld 98 70 - 99 mg/dL   BUN 12 6 - 23 mg/dL   Creatinine, Ser 6.64 0.40 - 1.20 mg/dL   Total  Bilirubin 1.0 0.2 - 1.2 mg/dL   Alkaline Phosphatase 83 39 - 117 U/L   AST 22 0 - 37 U/L   ALT 19 0 - 35 U/L   Total Protein 7.0 6.0 - 8.3 g/dL   Albumin 4.3 3.5 - 5.2 g/dL   GFR 40.34 >74.25 mL/min   Calcium 9.5 8.4 - 10.5 mg/dL  Hemoglobin Z5G  Result Value Ref Range   Hgb A1c MFr Bld 6.1 4.6 - 6.5 %  Lipid panel  Result Value Ref Range   Cholesterol 178 0 - 200 mg/dL   Triglycerides 387.5 0.0 - 149.0 mg/dL   HDL 64.33 >29.51 mg/dL   VLDL 88.4 0.0 - 16.6 mg/dL   LDL Cholesterol 063 (H) 0 - 99 mg/dL   Total CHOL/HDL Ratio 3    NonHDL 126.78   TSH  Result Value Ref Range   TSH 1.38 0.35 - 5.50 uIU/mL  VITAMIN D 25 Hydroxy (Vit-D Deficiency, Fractures)  Result Value Ref Range   VITD 22.15 (L) 30.00 - 100.00 ng/mL  B12  Result Value Ref Range   Vitamin B-12 348 211 - 911 pg/mL

## 2023-02-11 ENCOUNTER — Ambulatory Visit (INDEPENDENT_AMBULATORY_CARE_PROVIDER_SITE_OTHER): Payer: BC Managed Care – PPO | Admitting: Family Medicine

## 2023-02-11 VITALS — BP 118/62 | HR 83 | Temp 97.8°F | Resp 18 | Ht 60.0 in | Wt 177.8 lb

## 2023-02-11 DIAGNOSIS — E538 Deficiency of other specified B group vitamins: Secondary | ICD-10-CM | POA: Diagnosis not present

## 2023-02-11 DIAGNOSIS — R5383 Other fatigue: Secondary | ICD-10-CM | POA: Diagnosis not present

## 2023-02-11 DIAGNOSIS — E7800 Pure hypercholesterolemia, unspecified: Secondary | ICD-10-CM | POA: Diagnosis not present

## 2023-02-11 DIAGNOSIS — Z634 Disappearance and death of family member: Secondary | ICD-10-CM

## 2023-02-11 DIAGNOSIS — Z23 Encounter for immunization: Secondary | ICD-10-CM | POA: Diagnosis not present

## 2023-02-11 DIAGNOSIS — F4321 Adjustment disorder with depressed mood: Secondary | ICD-10-CM

## 2023-02-11 DIAGNOSIS — Z0000 Encounter for general adult medical examination without abnormal findings: Secondary | ICD-10-CM

## 2023-02-11 DIAGNOSIS — R7303 Prediabetes: Secondary | ICD-10-CM | POA: Diagnosis not present

## 2023-02-11 DIAGNOSIS — I10 Essential (primary) hypertension: Secondary | ICD-10-CM

## 2023-02-11 DIAGNOSIS — E559 Vitamin D deficiency, unspecified: Secondary | ICD-10-CM | POA: Diagnosis not present

## 2023-02-11 DIAGNOSIS — E669 Obesity, unspecified: Secondary | ICD-10-CM

## 2023-02-11 LAB — LIPID PANEL
Cholesterol: 178 mg/dL (ref 0–200)
HDL: 51.3 mg/dL (ref >39.00)
LDL Cholesterol: 106 mg/dL — ABNORMAL HIGH (ref 0–99)
NonHDL: 126.78
Total CHOL/HDL Ratio: 3
Triglycerides: 106 mg/dL (ref 0.0–149.0)
VLDL: 21.2 mg/dL (ref 0.0–40.0)

## 2023-02-11 LAB — COMPREHENSIVE METABOLIC PANEL
ALT: 19 U/L (ref 0–35)
AST: 22 U/L (ref 0–37)
Albumin: 4.3 g/dL (ref 3.5–5.2)
Alkaline Phosphatase: 83 U/L (ref 39–117)
BUN: 12 mg/dL (ref 6–23)
CO2: 29 mEq/L (ref 19–32)
Calcium: 9.5 mg/dL (ref 8.4–10.5)
Chloride: 100 mEq/L (ref 96–112)
Creatinine, Ser: 0.85 mg/dL (ref 0.40–1.20)
GFR: 77.42 mL/min (ref >60.00)
Glucose, Bld: 98 mg/dL (ref 70–99)
Potassium: 4 mEq/L (ref 3.5–5.1)
Sodium: 137 mEq/L (ref 135–145)
Total Bilirubin: 1 mg/dL (ref 0.2–1.2)
Total Protein: 7 g/dL (ref 6.0–8.3)

## 2023-02-11 LAB — HEMOGLOBIN A1C: Hgb A1c MFr Bld: 6.1 % (ref 4.6–6.5)

## 2023-02-11 LAB — CBC
HCT: 39.6 % (ref 36.0–46.0)
Hemoglobin: 13.2 g/dL (ref 12.0–15.0)
MCHC: 33.4 g/dL (ref 30.0–36.0)
MCV: 84.3 fl (ref 78.0–100.0)
Platelets: 272 10*3/uL (ref 150.0–400.0)
RBC: 4.7 Mil/uL (ref 3.87–5.11)
RDW: 13.7 % (ref 11.5–15.5)
WBC: 8 10*3/uL (ref 4.0–10.5)

## 2023-02-11 LAB — VITAMIN D 25 HYDROXY (VIT D DEFICIENCY, FRACTURES): VITD: 22.15 ng/mL — ABNORMAL LOW (ref 30.00–100.00)

## 2023-02-11 LAB — TSH: TSH: 1.38 u[IU]/mL (ref 0.35–5.50)

## 2023-02-11 LAB — VITAMIN B12: Vitamin B-12: 348 pg/mL (ref 211–911)

## 2023-02-12 ENCOUNTER — Encounter: Payer: Self-pay | Admitting: Family Medicine

## 2023-02-12 DIAGNOSIS — E6609 Other obesity due to excess calories: Secondary | ICD-10-CM

## 2023-02-13 MED ORDER — PHENTERMINE HCL 15 MG PO CAPS
15.0000 mg | ORAL_CAPSULE | ORAL | 3 refills | Status: DC
Start: 2023-02-13 — End: 2023-03-16

## 2023-03-16 ENCOUNTER — Encounter: Payer: Self-pay | Admitting: Family Medicine

## 2023-03-16 MED ORDER — PHENTERMINE HCL 37.5 MG PO CAPS
37.5000 mg | ORAL_CAPSULE | ORAL | 1 refills | Status: DC
Start: 2023-03-16 — End: 2024-09-19

## 2023-04-12 ENCOUNTER — Other Ambulatory Visit: Payer: Self-pay | Admitting: Family Medicine

## 2023-04-12 DIAGNOSIS — I10 Essential (primary) hypertension: Secondary | ICD-10-CM

## 2023-04-29 ENCOUNTER — Other Ambulatory Visit: Payer: Self-pay | Admitting: Family Medicine

## 2023-04-29 DIAGNOSIS — Z5181 Encounter for therapeutic drug level monitoring: Secondary | ICD-10-CM

## 2023-05-04 ENCOUNTER — Encounter: Payer: Self-pay | Admitting: Obstetrics and Gynecology

## 2023-08-01 ENCOUNTER — Other Ambulatory Visit: Payer: Self-pay | Admitting: Family Medicine

## 2023-08-01 DIAGNOSIS — I10 Essential (primary) hypertension: Secondary | ICD-10-CM

## 2023-10-31 ENCOUNTER — Other Ambulatory Visit: Payer: Self-pay | Admitting: Family Medicine

## 2023-10-31 DIAGNOSIS — I10 Essential (primary) hypertension: Secondary | ICD-10-CM

## 2023-10-31 DIAGNOSIS — Z5181 Encounter for therapeutic drug level monitoring: Secondary | ICD-10-CM

## 2023-11-04 ENCOUNTER — Telehealth: Payer: Self-pay | Admitting: Family Medicine

## 2023-11-04 DIAGNOSIS — I10 Essential (primary) hypertension: Secondary | ICD-10-CM

## 2023-11-04 DIAGNOSIS — Z5181 Encounter for therapeutic drug level monitoring: Secondary | ICD-10-CM

## 2023-11-04 NOTE — Telephone Encounter (Signed)
 Patient requesting: Benicar last refill 08/02/2023, 90x0 Toprol on 04/12/2023, 90x1 Lasix 04/30/2023, 180x1 Phentermine 03/16/2023, 30x1  Last OV 02/11/2023 Next OV - not scheduled.

## 2023-11-04 NOTE — Telephone Encounter (Signed)
 Copied from CRM [id number]. Topic: Clinical - Medication Refill >> Nov 04, 2023  1:43 PM Eleanor C wrote: Most Recent Primary Care Visit:  Provider: WATT RAISIN C  Department: [facility]  Visit Type: PHYSICAL  Date: 02/11/2023  Medication: metoprolol  succinate (TOPROL -XL) 50 MG 24 hr tablet olmesartan  (BENICAR ) 40 MG tablet furosemide  (LASIX ) 20 MG tablet phentermine  37.5 MG capsule  Has the patient contacted their pharmacy? Yes (Agent: If no, request that the patient contact the pharmacy for the refill. If patient does not wish to contact the pharmacy document the reason why and proceed with request.) (Agent: If yes, when and what did the pharmacy advise?)  Is this the correct pharmacy for this prescription? Yes If no, delete pharmacy and type the correct one.  This is the patient's preferred pharmacy:  [id number] [place], [place] - [address] [place] [address] Phone: [phone] Fax: [phone]   Has the prescription been filled recently? No  Is the patient out of the medication? No  Has the patient been seen for an appointment in the last year OR does the patient have an upcoming appointment? Yes  Can we respond through MyChart? Yes  Agent: Please be advised that Rx refills may take up to 3 business days. We ask that you follow-up with your pharmacy.

## 2023-11-05 ENCOUNTER — Encounter: Payer: Self-pay | Admitting: Family Medicine

## 2023-11-05 ENCOUNTER — Telehealth: Payer: Self-pay

## 2023-11-05 MED ORDER — OLMESARTAN MEDOXOMIL 40 MG PO TABS: 40.0000 mg | ORAL_TABLET | Freq: Every day | ORAL | 0 refills | Status: DC

## 2023-11-05 MED ORDER — FUROSEMIDE 20 MG PO TABS: 40.0000 mg | ORAL_TABLET | Freq: Every day | ORAL | 1 refills | Status: DC

## 2023-11-05 MED ORDER — METOPROLOL SUCCINATE ER 50 MG PO TB24: 50.0000 mg | ORAL_TABLET | Freq: Every day | ORAL | 1 refills | Status: DC

## 2023-11-05 NOTE — Telephone Encounter (Signed)
 Call addresed in other call note.

## 2023-11-05 NOTE — Telephone Encounter (Signed)
 Angela Wall (Patient)   Subject  Angela Wall (Patient)   Topic  Clinical - Medication Question    Communication  Reason for CRM: Patient would like Dr. Patsy Lager nurse to give a call / please call [phone]

## 2023-11-05 NOTE — Telephone Encounter (Signed)
 Pt sent a refill request to get refills on her meds and they were Denied. She is almost out and asks if you can refill her meds because she has been busy with work and will be sure to schedule an appt as as the weather clears. She requests 90 days because this is what insurance prefers.    Also: Pt has seen Derm (Dr Margene- [facility]) received a steroid injection for the Bx site.

## 2024-01-30 ENCOUNTER — Other Ambulatory Visit: Payer: Self-pay | Admitting: Family Medicine

## 2024-01-30 DIAGNOSIS — I10 Essential (primary) hypertension: Secondary | ICD-10-CM

## 2024-02-14 ENCOUNTER — Encounter: Admitting: Family Medicine

## 2024-02-22 NOTE — Progress Notes (Addendum)
 [facility] [address], [address] [place], [place] [address] [phone]  Date:  02/24/2024   Name:  Angela Wall   DOB:  04/18/1966   MRN:  [id number]  PCP:  Kaylee Partridge, MD    Chief Complaint: Annual Exam   History of Present Illness:  Angela Wall is a 59 y.o. very pleasant female patient who presents with the following:  Pt seen today for CPE- history of cervical spinal stenosis, hyperlipidemia, prediabetes, HTN, B12 and vitamin D  deficiency  Last visit with myself was one year ago   She has GYN care with Dr. Leslee Rase Cologuard appears to be due for update Labs 1 year ago, can update  We talked about a GLP-1 drug for weight loss last year but I do not believe we ended up using this -not affordable for her  Still having a hard time losing weight She has used phentermine  in the past and had success with weight loss, would like to go back on this medication if possible.  Her blood pressure looks good, she is able to check her blood pressure at home She is trying to walk for exercise  Wt Readings from Last 3 Encounters:  02/24/24 182 lb 9.6 oz (82.8 kg)  02/11/23 177 lb 12.8 oz (80.6 kg)  08/24/22 172 lb (78 kg)    She is taking vitamin D  and B12- she alternates days as taking them both bothered her stomach   She had an endo ablation so no menses for years She seems to be getting through her hot flashes -they are tapering off   Patient Active Problem List   Diagnosis Date Noted   Prediabetes 09/06/2020   Grief at loss of child 09/01/2020   Medial epicondylitis of right elbow 04/19/2019   Plantar fasciitis, left 05/24/2018   Tendonitis, Achilles, right 05/24/2018   SOB (shortness of breath) 03/07/2018   Abnormal bruising 12/09/2015   Breast pain, left 05/23/2015   Edema, lower extremity 05/23/2015   Pain in joint, shoulder region 06/05/2014   Knee pain, left 06/05/2014   Insomnia 06/05/2014    Other abnormal glucose 03/14/2012   Pure hypercholesterolemia 03/14/2012   SPINAL STENOSIS, CERVICAL 03/27/2010   Cervical spondylosis with myelopathy 03/19/2010   LACTOSE INTOLERANCE 12/31/2009   GERD 07/08/2009   Essential hypertension 07/05/2009    Past Medical History:  Diagnosis Date   Abnormal bruising 12/09/2015   Breast pain, left 05/23/2015   Cervical spondylosis with myelopathy 03/19/2010   Qualifier: Diagnosis of  By: Rochelle Chu MD, Randa Burton.    Edema 05/23/2015   Essential hypertension 07/05/2009   Qualifier: Diagnosis of  By: Rochelle Chu MD, Randa Burton.    GERD 07/08/2009   Qualifier: Diagnosis of  By: Rochelle Chu MD, Randa Burton.    GERD (gastroesophageal reflux disease)    Hypertension    Insomnia 06/05/2014   Knee pain, left 06/05/2014   LACTOSE INTOLERANCE 12/31/2009   Qualifier: Diagnosis of  By: Adan Holms MD Rito Chess    Other abnormal glucose 03/14/2012   Pain in joint involving right ankle and foot 11/06/2015   Pain in joint, shoulder region 06/05/2014   Pure hypercholesterolemia 03/14/2012   SPINAL STENOSIS, CERVICAL 03/27/2010   Qualifier: Diagnosis of  By: Rochelle Chu MD, Randa Burton.     Past Surgical History:  Procedure Laterality Date   BTL post-bilateral ectopics     ENDOMETRIAL ABLATION     ? Novasure  TUBAL LIGATION      Social History   Tobacco Use   Smoking status: Never   Smokeless tobacco: Never  Vaping Use   Vaping status: Never Used  Substance Use Topics   Alcohol use: Yes    Comment: social drinker   Drug use: No    Family History  Problem Relation Age of Onset   Cancer Mother        Breast cancer <50   Kidney disease Maternal Uncle    Stroke Maternal Uncle    Hypertension Other    Heart attack Paternal Grandmother     Allergies  Allergen Reactions   Oxycodone-Acetaminophen Itching   Oxycodone-Acetaminophen     REACTION: Itching   Propoxyphene N-Acetaminophen     REACTION: Itching    Medication list has been reviewed and updated.  Current  Outpatient Medications on File Prior to Visit  Medication Sig Dispense Refill   Diclofenac  Sodium (PENNSAID ) 2 % SOLN Place 1 application onto the skin 2 (two) times daily. 112 g 3   famotidine  (PEPCID ) 40 MG tablet Take 1 tablet (40 mg total) by mouth daily as needed for heartburn or indigestion. 90 tablet 3   fluticasone  (FLONASE ) 50 MCG/ACT nasal spray SPRAY 2 SPRAYS INTO EACH NOSTRIL EVERY DAY 48 mL 0   levocetirizine (XYZAL ) 5 MG tablet TAKE 1 TABLET BY MOUTH EVERY DAY IN THE EVENING 30 tablet 2   LORazepam  (ATIVAN ) 0.5 MG tablet Use every 12 hours as needed for anxiety 30 tablet 0   phentermine  37.5 MG capsule Take 1 capsule (37.5 mg total) by mouth every morning. 30 capsule 1   No current facility-administered medications on file prior to visit.    Review of Systems:  As per HPI- otherwise negative.   Physical Examination: Vitals:   02/24/24 1326  BP: 116/72  Pulse: 84  Resp: 18  Temp: 97.9 F (36.6 C)  SpO2: 98%   Vitals:   02/24/24 1326  Weight: 182 lb 9.6 oz (82.8 kg)  Height: 5\' 1"  (1.549 m)   Body mass index is 34.5 kg/m. Ideal Body Weight: Weight in (lb) to have BMI = 25: 132  GEN: no acute distress.  Obese, looks well  HEENT: Atraumatic, Normocephalic.  Bilateral TM wnl, oropharynx normal.  PEERL,EOMI.   Ears and Nose: No external deformity. CV: RRR, No M/G/R. No JVD. No thrill. No extra heart sounds. PULM: CTA B, no wheezes, crackles, rhonchi. No retractions. No resp. distress. No accessory muscle use. ABD: S, NT, ND, +BS. No rebound. No HSM. EXTR: No c/c/e PSYCH: Normally interactive. Conversant.    Assessment and Plan: Physical exam  Essential hypertension - Plan: CBC, Comprehensive metabolic panel with GFR, metoprolol  succinate (TOPROL -XL) 50 MG 24 hr tablet, olmesartan  (BENICAR ) 40 MG tablet  Vitamin B12 deficiency - Plan: B12  Prediabetes - Plan: Hemoglobin A1c  Vitamin D  deficiency - Plan: VITAMIN D  25 Hydroxy (Vit-D Deficiency,  Fractures)  Pure hypercholesterolemia - Plan: Lipid panel  Thyroid  disorder screening - Plan: TSH  Screening for colon cancer - Plan: Ambulatory referral to Gastroenterology  Medication monitoring encounter - Plan: furosemide  (LASIX ) 20 MG tablet  Obesity (BMI 30-39.9) - Plan: phentermine  15 MG capsule, phentermine  37.5 MG capsule  Physical exam today.  Encouraged healthy diet and exercise routine Will plan further follow- up pending labs. She would like to do a colonoscopy this time for colon cancer screening, referred to gastroenterology  She is using furosemide , metoprolol , olmesartan  for blood pressure control.  Will monitor  her electrolytes  Will have her use phentermine  15 mg for 2 weeks, then she can go up to 37.5.  She will keep an eye on her blood pressure at home and let me know if it is increasing  Signed Gates Kasal, MD  Addnd 5/2- received labs, message to pt  Results for orders placed or performed in visit on 02/24/24  CBC   Collection Time: 02/24/24  1:46 PM  Result Value Ref Range   WBC 6.8 4.0 - 10.5 K/uL   RBC 4.57 3.87 - 5.11 Mil/uL   Platelets 290.0 150.0 - 400.0 K/uL   Hemoglobin 12.8 12.0 - 15.0 g/dL   HCT 40.9 81.1 - 91.4 %   MCV 86.8 78.0 - 100.0 fl   MCHC 32.3 30.0 - 36.0 g/dL   RDW 78.2 95.6 - 21.3 %  Comprehensive metabolic panel with GFR   Collection Time: 02/24/24  1:46 PM  Result Value Ref Range   Sodium 139 135 - 145 mEq/L   Potassium 4.4 3.5 - 5.1 mEq/L   Chloride 102 96 - 112 mEq/L   CO2 28 19 - 32 mEq/L   Glucose, Bld 144 (H) 70 - 99 mg/dL   BUN 13 6 - 23 mg/dL   Creatinine, Ser 0.86 0.40 - 1.20 mg/dL   Total Bilirubin 1.0 0.2 - 1.2 mg/dL   Alkaline Phosphatase 89 39 - 117 U/L   AST 17 0 - 37 U/L   ALT 15 0 - 35 U/L   Total Protein 7.0 6.0 - 8.3 g/dL   Albumin 4.4 3.5 - 5.2 g/dL   GFR 57.84 >69.62 mL/min   Calcium 9.7 8.4 - 10.5 mg/dL  Hemoglobin X5M   Collection Time: 02/24/24  1:46 PM  Result Value Ref Range   Hgb A1c  MFr Bld 6.3 4.6 - 6.5 %  Lipid panel   Collection Time: 02/24/24  1:46 PM  Result Value Ref Range   Cholesterol 161 0 - 200 mg/dL   Triglycerides 84.1 0.0 - 149.0 mg/dL   HDL 32.44 >01.02 mg/dL   VLDL 72.5 0.0 - 36.6 mg/dL   LDL Cholesterol 440 (H) 0 - 99 mg/dL   Total CHOL/HDL Ratio 4    NonHDL 116.75   TSH   Collection Time: 02/24/24  1:46 PM  Result Value Ref Range   TSH 1.37 0.35 - 5.50 uIU/mL  VITAMIN D  25 Hydroxy (Vit-D Deficiency, Fractures)   Collection Time: 02/24/24  1:46 PM  Result Value Ref Range   VITD 25.33 (L) 30.00 - 100.00 ng/mL  B12   Collection Time: 02/24/24  1:46 PM  Result Value Ref Range   Vitamin B-12 756 211 - 911 pg/mL

## 2024-02-22 NOTE — Patient Instructions (Addendum)
 It was great to see you today, I will be in touch with your lab work asap  Start on phentermine  15 mg for 2 weeks, then go up to 37.5 mg   Please do keep an eye on your BP at home- let me know if going too high   I will send you to [facility] for your colonoscopy

## 2024-02-24 ENCOUNTER — Ambulatory Visit (INDEPENDENT_AMBULATORY_CARE_PROVIDER_SITE_OTHER): Payer: Self-pay | Admitting: Family Medicine

## 2024-02-24 ENCOUNTER — Encounter: Admitting: Family Medicine

## 2024-02-24 VITALS — BP 116/72 | HR 84 | Temp 97.9°F | Resp 18 | Ht 61.0 in | Wt 182.6 lb

## 2024-02-24 DIAGNOSIS — R7303 Prediabetes: Secondary | ICD-10-CM

## 2024-02-24 DIAGNOSIS — Z1329 Encounter for screening for other suspected endocrine disorder: Secondary | ICD-10-CM | POA: Diagnosis not present

## 2024-02-24 DIAGNOSIS — Z5181 Encounter for therapeutic drug level monitoring: Secondary | ICD-10-CM

## 2024-02-24 DIAGNOSIS — E7800 Pure hypercholesterolemia, unspecified: Secondary | ICD-10-CM

## 2024-02-24 DIAGNOSIS — E559 Vitamin D deficiency, unspecified: Secondary | ICD-10-CM

## 2024-02-24 DIAGNOSIS — I10 Essential (primary) hypertension: Secondary | ICD-10-CM

## 2024-02-24 DIAGNOSIS — E669 Obesity, unspecified: Secondary | ICD-10-CM

## 2024-02-24 DIAGNOSIS — Z0000 Encounter for general adult medical examination without abnormal findings: Secondary | ICD-10-CM | POA: Diagnosis not present

## 2024-02-24 DIAGNOSIS — E538 Deficiency of other specified B group vitamins: Secondary | ICD-10-CM

## 2024-02-24 DIAGNOSIS — Z1211 Encounter for screening for malignant neoplasm of colon: Secondary | ICD-10-CM

## 2024-02-24 MED ORDER — FUROSEMIDE 20 MG PO TABS: 40.0000 mg | ORAL_TABLET | Freq: Every day | ORAL | 3 refills | Status: AC

## 2024-02-24 MED ORDER — OLMESARTAN MEDOXOMIL 40 MG PO TABS: 40.0000 mg | ORAL_TABLET | Freq: Every day | ORAL | 3 refills | Status: AC

## 2024-02-24 MED ORDER — PHENTERMINE HCL 15 MG PO CAPS: 15.0000 mg | ORAL_CAPSULE | ORAL | 0 refills | Status: DC

## 2024-02-24 MED ORDER — PHENTERMINE HCL 37.5 MG PO CAPS: 37.5000 mg | ORAL_CAPSULE | ORAL | 1 refills | Status: DC

## 2024-02-24 MED ORDER — METOPROLOL SUCCINATE ER 50 MG PO TB24: 50.0000 mg | ORAL_TABLET | Freq: Every day | ORAL | 3 refills | Status: AC

## 2024-02-25 ENCOUNTER — Encounter: Payer: Self-pay | Admitting: Family Medicine

## 2024-02-25 ENCOUNTER — Telehealth: Payer: Self-pay

## 2024-02-25 LAB — LIPID PANEL
Cholesterol: 161 mg/dL (ref 0–200)
HDL: 44.3 mg/dL (ref >39.00)
LDL Cholesterol: 100 mg/dL — ABNORMAL HIGH (ref 0–99)
NonHDL: 116.75
Total CHOL/HDL Ratio: 4
Triglycerides: 82 mg/dL (ref 0.0–149.0)
VLDL: 16.4 mg/dL (ref 0.0–40.0)

## 2024-02-25 LAB — COMPREHENSIVE METABOLIC PANEL WITH GFR
ALT: 15 U/L (ref 0–35)
AST: 17 U/L (ref 0–37)
Albumin: 4.4 g/dL (ref 3.5–5.2)
Alkaline Phosphatase: 89 U/L (ref 39–117)
BUN: 13 mg/dL (ref 6–23)
CO2: 28 meq/L (ref 19–32)
Calcium: 9.7 mg/dL (ref 8.4–10.5)
Chloride: 102 meq/L (ref 96–112)
Creatinine, Ser: 0.92 mg/dL (ref 0.40–1.20)
GFR: 69.89 mL/min (ref >60.00)
Glucose, Bld: 144 mg/dL — ABNORMAL HIGH (ref 70–99)
Potassium: 4.4 meq/L (ref 3.5–5.1)
Sodium: 139 meq/L (ref 135–145)
Total Bilirubin: 1 mg/dL (ref 0.2–1.2)
Total Protein: 7 g/dL (ref 6.0–8.3)

## 2024-02-25 LAB — CBC
HCT: 39.6 % (ref 36.0–46.0)
Hemoglobin: 12.8 g/dL (ref 12.0–15.0)
MCHC: 32.3 g/dL (ref 30.0–36.0)
MCV: 86.8 fl (ref 78.0–100.0)
Platelets: 290 10*3/uL (ref 150.0–400.0)
RBC: 4.57 Mil/uL (ref 3.87–5.11)
RDW: 14.8 % (ref 11.5–15.5)
WBC: 6.8 10*3/uL (ref 4.0–10.5)

## 2024-02-25 LAB — VITAMIN D 25 HYDROXY (VIT D DEFICIENCY, FRACTURES): VITD: 25.33 ng/mL — ABNORMAL LOW (ref 30.00–100.00)

## 2024-02-25 LAB — HEMOGLOBIN A1C: Hgb A1c MFr Bld: 6.3 % (ref 4.6–6.5)

## 2024-02-25 LAB — TSH: TSH: 1.37 u[IU]/mL (ref 0.35–5.50)

## 2024-02-25 LAB — VITAMIN B12: Vitamin B-12: 756 pg/mL (ref 211–911)

## 2024-02-25 NOTE — Telephone Encounter (Signed)
 Copied from CRM [id number]. Topic: Clinical - Lab/Test Results >> Feb 25, 2024  4:40 PM Angela Wall wrote: Reason for CRM: Patient called to get the status of her Lab results from 02/24/24 appt  Phone number [phone] (M)

## 2024-02-28 NOTE — Telephone Encounter (Signed)
 Results sent to Pt via Mychart from PCP.

## 2024-03-30 ENCOUNTER — Encounter: Payer: Self-pay | Admitting: Family Medicine

## 2024-04-19 ENCOUNTER — Telehealth: Admitting: Physician Assistant

## 2024-04-19 DIAGNOSIS — L304 Erythema intertrigo: Secondary | ICD-10-CM

## 2024-04-19 MED ORDER — NYSTATIN 100000 UNIT/GM EX OINT: 1.0000 | TOPICAL_OINTMENT | Freq: Two times a day (BID) | CUTANEOUS | 0 refills | Status: AC

## 2024-04-19 NOTE — Progress Notes (Signed)
 MyChart Video Visit    Virtual Visit via Video Note   This format is felt to be most appropriate for this patient at this time. Physical exam was limited by quality of the video and audio technology used for the visit.   Patient location: home Provider location: [email or web address]  I discussed the limitations of evaluation and management by telemedicine and the availability of in person appointments. The patient expressed understanding and agreed to proceed.  Patient: Angela Wall   DOB: 1966-10-08   58 y.o. Female  MRN: [id number] Visit Date: 04/19/2024  Today's healthcare provider: Manuelita Flatness, PA-C   Chief Complaint  Patient presents with   Rash   Subjective    Rash Pertinent negatives include no cough, fatigue, fever or shortness of breath.     Pt reports a red itchy rash presented under b/l breasts x 2 days. Reports was working in a warm environment and was sweatier than usual.    Medications: Outpatient Medications Prior to Visit  Medication Sig   Diclofenac  Sodium (PENNSAID ) 2 % SOLN Place 1 application onto the skin 2 (two) times daily.   famotidine  (PEPCID ) 40 MG tablet Take 1 tablet (40 mg total) by mouth daily as needed for heartburn or indigestion.   fluticasone  (FLONASE ) 50 MCG/ACT nasal spray SPRAY 2 SPRAYS INTO EACH NOSTRIL EVERY DAY   furosemide  (LASIX ) 20 MG tablet Take 2 tablets (40 mg total) by mouth daily.   levocetirizine (XYZAL ) 5 MG tablet TAKE 1 TABLET BY MOUTH EVERY DAY IN THE EVENING   LORazepam  (ATIVAN ) 0.5 MG tablet Use every 12 hours as needed for anxiety   metoprolol  succinate (TOPROL -XL) 50 MG 24 hr tablet Take 1 tablet (50 mg total) by mouth daily. Take with or immediately following a meal.   olmesartan  (BENICAR ) 40 MG tablet Take 1 tablet (40 mg total) by mouth daily.   phentermine  15 MG capsule Take 1 capsule (15 mg total) by mouth every morning.   phentermine  37.5 MG capsule Take 1 capsule (37.5 mg total) by mouth every morning.    phentermine  37.5 MG capsule Take 1 capsule (37.5 mg total) by mouth every morning. Take 15 mg for 2 weeks then can increase   No facility-administered medications prior to visit.    Review of Systems  Constitutional:  Negative for fatigue and fever.  Respiratory:  Negative for cough and shortness of breath.   Cardiovascular:  Negative for chest pain and leg swelling.  Gastrointestinal:  Negative for abdominal pain.  Skin:  Positive for rash.  Neurological:  Negative for dizziness and headaches.        Objective    There were no vitals taken for this visit.      Physical Exam Vitals reviewed.  Constitutional:      Appearance: She is not ill-appearing.  HENT:     Head: Normocephalic.   Eyes:     Conjunctiva/sclera: Conjunctivae normal.    Cardiovascular:     Rate and Rhythm: Normal rate.  Pulmonary:     [place]: Pulmonary effort is normal. No respiratory distress.   Skin:    Comments: Able to see an erythematous rash under b/l breasts   Neurological:     Mental Status: She is alert and oriented to person, place, and time.   Psychiatric:        Mood and Affect: Mood normal.        Behavior: Behavior normal.        Assessment & Plan  Intertrigo (Primary) Pt prefers ointment. Rx nystatin ointment bid on area on concern. Recommending otc antifungal powder if going to be sweaty/warm environment.  If no improvement would rx topical ketoconazole  - nystatin ointment (MYCOSTATIN); Apply 1 Application topically 2 (two) times daily.  Dispense: 30 g; Refill: 0   Return if symptoms worsen or fail to improve.     I discussed the assessment and treatment plan with the patient. The patient was provided an opportunity to ask questions and all were answered. The patient agreed with the plan and demonstrated an understanding of the instructions.   The patient was advised to call back or seek an in-person evaluation if the symptoms worsen or if the condition fails to  improve as anticipated.  Manuelita Flatness, PA-C [facility] [phone] (phone) [phone] (fax)  [facility]

## 2024-07-18 LAB — HM MAMMOGRAPHY

## 2024-09-16 NOTE — Progress Notes (Unsigned)
 [facility] [address], [address] [place], [place] [address] [phone]  Date:  09/18/2024   Name:  Angela Wall   DOB:  1966/08/19   MRN:  [id number]  PCP:  Watt Harlene BROCKS, MD    Chief Complaint: No chief complaint on file.   History of Present Illness:  Angela Wall is a 58 y.o. very pleasant female patient who presents with the following:  Patient seen today to follow-up on labs.  She is now taking a GLP-1 and would like to see how she is doing.  She was previously using phentermine  for weight loss I saw her most recently in May - history of cervical spinal stenosis, hyperlipidemia, prediabetes, HTN, B12 and vitamin D  deficiency   Discussed the use of AI scribe software for clinical note transcription with the patient, who gave verbal consent to proceed.  History of Present Illness    Patient Active Problem List   Diagnosis Date Noted   Prediabetes 09/06/2020   Grief at loss of child 09/01/2020   Medial epicondylitis of right elbow 04/19/2019   Plantar fasciitis, left 05/24/2018   Tendonitis, Achilles, right 05/24/2018   SOB (shortness of breath) 03/07/2018   Abnormal bruising 12/09/2015   Breast pain, left 05/23/2015   Edema, lower extremity 05/23/2015   Pain in joint, shoulder region 06/05/2014   Knee pain, left 06/05/2014   Insomnia 06/05/2014   Other abnormal glucose 03/14/2012   Pure hypercholesterolemia 03/14/2012   SPINAL STENOSIS, CERVICAL 03/27/2010   Cervical spondylosis with myelopathy 03/19/2010   LACTOSE INTOLERANCE 12/31/2009   GERD 07/08/2009   Essential hypertension 07/05/2009    Past Medical History:  Diagnosis Date   Abnormal bruising 12/09/2015   Breast pain, left 05/23/2015   Cervical spondylosis with myelopathy 03/19/2010   Qualifier: Diagnosis of  By: Joshua MD, Debby CROME.    Edema 05/23/2015   Essential hypertension 07/05/2009   Qualifier: Diagnosis of  By: Joshua MD,  Debby CROME.    GERD 07/08/2009   Qualifier: Diagnosis of  By: Joshua MD, Debby CROME.    GERD (gastroesophageal reflux disease)    Hypertension    Insomnia 06/05/2014   Knee pain, left 06/05/2014   LACTOSE INTOLERANCE 12/31/2009   Qualifier: Diagnosis of  By: Jakie MD NOLIA Alm SAUNDERS    Other abnormal glucose 03/14/2012   Pain in joint involving right ankle and foot 11/06/2015   Pain in joint, shoulder region 06/05/2014   Pure hypercholesterolemia 03/14/2012   SPINAL STENOSIS, CERVICAL 03/27/2010   Qualifier: Diagnosis of  By: Joshua MD, Debby CROME.     Past Surgical History:  Procedure Laterality Date   BTL post-bilateral ectopics     ENDOMETRIAL ABLATION     ? Novasure   TUBAL LIGATION      Social History   Tobacco Use   Smoking status: Never   Smokeless tobacco: Never  Vaping Use   Vaping status: Never Used  Substance Use Topics   Alcohol use: Yes    Comment: social drinker   Drug use: No    Family History  Problem Relation Age of Onset   Cancer Mother        Breast cancer <50   Kidney disease Maternal Uncle    Stroke Maternal Uncle    Hypertension Other    Heart attack Paternal Grandmother     Allergies  Allergen Reactions   Oxycodone-Acetaminophen Itching   Oxycodone-Acetaminophen  REACTION: Itching   Propoxyphene N-Acetaminophen     REACTION: Itching    Medication list has been reviewed and updated.  Current Outpatient Medications on File Prior to Visit  Medication Sig Dispense Refill   Diclofenac  Sodium (PENNSAID ) 2 % SOLN Place 1 application onto the skin 2 (two) times daily. 112 g 3   famotidine  (PEPCID ) 40 MG tablet Take 1 tablet (40 mg total) by mouth daily as needed for heartburn or indigestion. 90 tablet 3   fluticasone  (FLONASE ) 50 MCG/ACT nasal spray SPRAY 2 SPRAYS INTO EACH NOSTRIL EVERY DAY 48 mL 0   furosemide  (LASIX ) 20 MG tablet Take 2 tablets (40 mg total) by mouth daily. 180 tablet 3   levocetirizine (XYZAL ) 5 MG tablet TAKE 1 TABLET BY MOUTH  EVERY DAY IN THE EVENING 30 tablet 2   LORazepam  (ATIVAN ) 0.5 MG tablet Use every 12 hours as needed for anxiety 30 tablet 0   metoprolol  succinate (TOPROL -XL) 50 MG 24 hr tablet Take 1 tablet (50 mg total) by mouth daily. Take with or immediately following a meal. 90 tablet 3   nystatin  ointment (MYCOSTATIN ) Apply 1 Application topically 2 (two) times daily. 30 g 0   olmesartan  (BENICAR ) 40 MG tablet Take 1 tablet (40 mg total) by mouth daily. 90 tablet 3   phentermine  15 MG capsule Take 1 capsule (15 mg total) by mouth every morning. 14 capsule 0   phentermine  37.5 MG capsule Take 1 capsule (37.5 mg total) by mouth every morning. 30 capsule 1   phentermine  37.5 MG capsule Take 1 capsule (37.5 mg total) by mouth every morning. Take 15 mg for 2 weeks then can increase 30 capsule 1   No current facility-administered medications on file prior to visit.    Review of Systems:  As per HPI- otherwise negative.   Physical Examination: There were no vitals filed for this visit. There were no vitals filed for this visit. There is no height or weight on file to calculate BMI. Ideal Body Weight:    GEN: no acute distress. HEENT: Atraumatic, Normocephalic.  Ears and Nose: No external deformity. CV: RRR, No M/G/R. No JVD. No thrill. No extra heart sounds. PULM: CTA B, no wheezes, crackles, rhonchi. No retractions. No resp. distress. No accessory muscle use. ABD: S, NT, ND, +BS. No rebound. No HSM. EXTR: No c/c/e PSYCH: Normally interactive. Conversant.    Assessment and Plan: No diagnosis found.  Assessment & Plan   Signed Harlene Schroeder, MD

## 2024-09-18 ENCOUNTER — Ambulatory Visit: Admitting: Family Medicine

## 2024-09-18 VITALS — BP 130/84 | HR 97 | Temp 98.2°F | Ht 61.0 in | Wt 162.2 lb

## 2024-09-18 DIAGNOSIS — R7303 Prediabetes: Secondary | ICD-10-CM

## 2024-09-18 DIAGNOSIS — Z13220 Encounter for screening for lipoid disorders: Secondary | ICD-10-CM

## 2024-09-18 DIAGNOSIS — I10 Essential (primary) hypertension: Secondary | ICD-10-CM

## 2024-09-18 DIAGNOSIS — Z1211 Encounter for screening for malignant neoplasm of colon: Secondary | ICD-10-CM

## 2024-09-18 DIAGNOSIS — E559 Vitamin D deficiency, unspecified: Secondary | ICD-10-CM

## 2024-09-18 NOTE — Patient Instructions (Addendum)
 Great job with weight loss!  I will be in touch with your labs We will get you a Cologuard kit  If all is well please see me in 6 months

## 2024-09-19 ENCOUNTER — Encounter: Payer: Self-pay | Admitting: Family Medicine

## 2024-09-19 ENCOUNTER — Encounter: Payer: Self-pay | Admitting: *Deleted

## 2024-09-19 DIAGNOSIS — E7800 Pure hypercholesterolemia, unspecified: Secondary | ICD-10-CM

## 2024-09-19 DIAGNOSIS — E559 Vitamin D deficiency, unspecified: Secondary | ICD-10-CM

## 2024-09-19 DIAGNOSIS — E538 Deficiency of other specified B group vitamins: Secondary | ICD-10-CM

## 2024-09-19 LAB — LIPID PANEL
Cholesterol: 174 mg/dL (ref 0–200)
HDL: 48.6 mg/dL (ref >39.00)
LDL Cholesterol: 110 mg/dL — ABNORMAL HIGH (ref 0–99)
NonHDL: 125.22
Total CHOL/HDL Ratio: 4
Triglycerides: 76 mg/dL (ref 0.0–149.0)
VLDL: 15.2 mg/dL (ref 0.0–40.0)

## 2024-09-19 LAB — VITAMIN D 25 HYDROXY (VIT D DEFICIENCY, FRACTURES): VITD: 27.3 ng/mL — ABNORMAL LOW (ref 30.00–100.00)

## 2024-09-19 LAB — HEMOGLOBIN A1C: Hgb A1c MFr Bld: 5.3 % (ref 4.6–6.5)

## 2024-09-19 NOTE — Addendum Note (Signed)
 Addended by: WATT RAISIN C on: 09/19/2024 02:27 PM   Modules accepted: Orders

## 2024-10-25 LAB — COLOGUARD: COLOGUARD: NEGATIVE

## 2024-10-26 ENCOUNTER — Encounter: Payer: Self-pay | Admitting: Family Medicine
